# Patient Record
Sex: Female | Born: 1975 | Race: White | Hispanic: No | Smoking: Current every day smoker
Health system: Southern US, Community
[De-identification: ages and names within clinical notes are randomized; demographics above are authoritative.]

## PROBLEM LIST (undated history)

## (undated) DIAGNOSIS — N12 Tubulo-interstitial nephritis, not specified as acute or chronic: Secondary | ICD-10-CM

## (undated) DIAGNOSIS — M797 Fibromyalgia: Secondary | ICD-10-CM

## (undated) DIAGNOSIS — F4310 Post-traumatic stress disorder, unspecified: Secondary | ICD-10-CM

## (undated) HISTORY — PX: CHOLECYSTECTOMY: SHX55

## (undated) HISTORY — PX: TUBAL LIGATION: SHX77

---

## 2004-10-02 ENCOUNTER — Emergency Department: Payer: Self-pay | Admitting: Emergency Medicine

## 2007-01-23 ENCOUNTER — Emergency Department: Payer: Self-pay | Admitting: Emergency Medicine

## 2008-09-30 ENCOUNTER — Emergency Department: Payer: Self-pay | Admitting: Emergency Medicine

## 2011-06-21 ENCOUNTER — Emergency Department: Payer: Self-pay | Admitting: *Deleted

## 2011-06-21 LAB — COMPREHENSIVE METABOLIC PANEL
Bilirubin,Total: 0.3 mg/dL (ref 0.2–1.0)
Chloride: 103 mmol/L (ref 98–107)
Co2: 30 mmol/L (ref 21–32)
Creatinine: 0.88 mg/dL (ref 0.60–1.30)
EGFR (African American): >60
EGFR (Non-African Amer.): >60
Glucose: 79 mg/dL (ref 65–99)
Osmolality: 280 (ref 275–301)
Sodium: 141 mmol/L (ref 136–145)
Total Protein: 7 g/dL (ref 6.4–8.2)

## 2011-06-21 LAB — ETHANOL: Ethanol %: <0.003 % (ref 0.000–0.080)

## 2011-06-21 LAB — CBC
HCT: 42 % (ref 35.0–47.0)
HGB: 14.1 g/dL (ref 12.0–16.0)
MCH: 30.9 pg (ref 26.0–34.0)
MCV: 92 fL (ref 80–100)
Platelet: 272 10*3/uL (ref 150–440)
RBC: 4.56 10*6/uL (ref 3.80–5.20)

## 2011-06-21 LAB — TSH: Thyroid Stimulating Horm: 0.825 u[IU]/mL

## 2012-11-13 ENCOUNTER — Emergency Department: Payer: Self-pay | Admitting: Emergency Medicine

## 2012-11-13 LAB — COMPREHENSIVE METABOLIC PANEL
Alkaline Phosphatase: 77 U/L (ref 50–136)
Anion Gap: 9 (ref 7–16)
Bilirubin,Total: 0.5 mg/dL (ref 0.2–1.0)
Calcium, Total: 9.3 mg/dL (ref 8.5–10.1)
Chloride: 106 mmol/L (ref 98–107)
Co2: 24 mmol/L (ref 21–32)
Glucose: 131 mg/dL — ABNORMAL HIGH (ref 65–99)
Osmolality: 278 (ref 275–301)
Potassium: 3.9 mmol/L (ref 3.5–5.1)
Sodium: 139 mmol/L (ref 136–145)
Total Protein: 7.9 g/dL (ref 6.4–8.2)

## 2012-11-13 LAB — CBC
HCT: 43.9 % (ref 35.0–47.0)
HGB: 15 g/dL (ref 12.0–16.0)
MCH: 29.9 pg (ref 26.0–34.0)
MCHC: 34.2 g/dL (ref 32.0–36.0)
Platelet: 293 10*3/uL (ref 150–440)
RDW: 12.5 % (ref 11.5–14.5)
WBC: 10.2 10*3/uL (ref 3.6–11.0)

## 2012-11-13 LAB — URINALYSIS, COMPLETE
Bacteria: NONE SEEN
Bilirubin,UR: NEGATIVE
Glucose,UR: NEGATIVE mg/dL (ref 0–75)
Ph: 6 (ref 4.5–8.0)
WBC UR: 1 /HPF (ref 0–5)

## 2012-11-13 LAB — DRUG SCREEN, URINE
Amphetamines, Ur Screen: NEGATIVE (ref ?–1000)
Barbiturates, Ur Screen: NEGATIVE (ref ?–200)
Benzodiazepine, Ur Scrn: NEGATIVE (ref ?–200)
Cannabinoid 50 Ng, Ur ~~LOC~~: POSITIVE (ref ?–50)
Cocaine Metabolite,Ur ~~LOC~~: NEGATIVE (ref ?–300)
MDMA (Ecstasy)Ur Screen: NEGATIVE (ref ?–500)
Phencyclidine (PCP) Ur S: NEGATIVE (ref ?–25)

## 2012-11-13 LAB — TROPONIN I: Troponin-I: <0.02 ng/mL

## 2012-11-13 LAB — CK TOTAL AND CKMB (NOT AT ARMC): CK, Total: 79 U/L (ref 21–215)

## 2013-06-05 ENCOUNTER — Encounter: Payer: Self-pay | Admitting: Family Medicine

## 2013-07-01 ENCOUNTER — Encounter: Payer: Self-pay | Admitting: Family Medicine

## 2013-10-10 ENCOUNTER — Ambulatory Visit: Payer: Self-pay | Admitting: Surgery

## 2013-10-10 LAB — CBC
HCT: 40.7 % (ref 35.0–47.0)
HGB: 13.5 g/dL (ref 12.0–16.0)
MCH: 29.7 pg (ref 26.0–34.0)
MCHC: 33.1 g/dL (ref 32.0–36.0)
MCV: 90 fL (ref 80–100)
PLATELETS: 302 10*3/uL (ref 150–440)
RBC: 4.55 10*6/uL (ref 3.80–5.20)
RDW: 13.1 % (ref 11.5–14.5)
WBC: 8.4 10*3/uL (ref 3.6–11.0)

## 2013-10-11 ENCOUNTER — Ambulatory Visit: Payer: Self-pay | Admitting: Surgery

## 2013-10-12 LAB — PATHOLOGY REPORT

## 2014-02-28 ENCOUNTER — Emergency Department: Payer: Self-pay | Admitting: Student

## 2014-08-16 ENCOUNTER — Emergency Department: Payer: Self-pay | Admitting: Emergency Medicine

## 2014-09-21 NOTE — Op Note (Signed)
PATIENT NAME:  [name], Madeline [name] DATE OF BIRTH:  Nov 29, 1975  DATE OF PROCEDURE:  10/11/2013  PREOPERATIVE DIAGNOSIS: Hemorrhoids.   POSTOPERATIVE DIAGNOSIS: Hemorrhoids.   PROCEDURE: Hemorrhoidectomy.   SURGEON: Adella HareJ. Wilton [name], MD   ANESTHESIA: General.   INDICATIONS: This 39 year old female came in with a chief complaint of anal pain. She dates this back to originally developing hemorrhoids in 2003. She has recently had a popping sensation in the anal area with continued anal pain. No relief with hemorrhoid suppositories. Hemorrhoids were demonstrated on physical exam and recommended surgery.   DESCRIPTION OF PROCEDURE: The patient was placed on the operating table in the supine position under general anesthesia. Legs were elevated into the lithotomy position using ankle straps. The anal area was prepared with Betadine solution and draped with sterile towels and sheets.   There was a large hemorrhoid which was posterior and in the midline which was approximately 1.5 cm in dimension. The anoderm was infiltrated with 0.5% Sensorcaine with epinephrine and also deeper tissues surrounding the sphincter were infiltrated as well. Next, the anal canal was dilated large enough to admit 3 fingers. The bivalve anal retractor was introduced, demonstrating an internal hemorrhoid which is also posterior and slightly to the right of the midline. There were no other significantly enlarged hemorrhoids. No polyps or tumors were seen. No fissure was seen. Next, a high ligation of the internal component was done with a 2-0 chromic suture ligature. The incision was made externally in a V-shaped incision and was carried around the hemorrhoid. Next, numerous small bleeding points were cauterized. The external hemorrhoid was dissected away from the surrounding subcutaneous tissues. Dissection was carried up over the internal anal sphincter, which was identified, and dissection carried up to the previously  placed suture ligature, where the hemorrhoid was further ligated with the same suture ligature and then was excised. It was submitted in formalin for routine pathology. The wound was inspected. Several small bleeding points were cauterized. Some additional Sensorcaine with epinephrine was infiltrated within the wound. Next, the wound was repaired with a running 2-0 chromic locked stitch and left a small opening externally for drainage. Hemostasis appeared to be intact. Next, the bivalve anal retractor was removed. The dressings were applied with paper tape. The patient tolerated surgery satisfactorily and was then prepared for transfer to the recovery room.   ____________________________ Shela CommonsJ. Renda RollsWilton [name], MD jws:lb D: 10/11/2013 08:27:53 ET T: 10/11/2013 08:47:08 ET JOB#: [id number]  cc: Adella HareJ. Wilton [name], MD, <Dictator> Adella HareWILTON J [name] MD ELECTRONICALLY SIGNED 10/15/2013 21:07
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
## 2014-11-11 ENCOUNTER — Emergency Department
Admission: EM | Admit: 2014-11-11 | Discharge: 2014-11-11 | Disposition: A | Discharge status: Home / Self Care | Payer: No Typology Code available for payment source | Attending: Emergency Medicine | Admitting: Emergency Medicine

## 2014-11-11 ENCOUNTER — Encounter: Payer: Self-pay | Admitting: Emergency Medicine

## 2014-11-11 ENCOUNTER — Emergency Department: Payer: No Typology Code available for payment source

## 2014-11-11 DIAGNOSIS — S161XXA Strain of muscle, fascia and tendon at neck level, initial encounter: Secondary | ICD-10-CM | POA: Diagnosis not present

## 2014-11-11 DIAGNOSIS — Y9389 Activity, other specified: Secondary | ICD-10-CM | POA: Insufficient documentation

## 2014-11-11 DIAGNOSIS — Y92410 Unspecified street and highway as the place of occurrence of the external cause: Secondary | ICD-10-CM | POA: Diagnosis not present

## 2014-11-11 DIAGNOSIS — Y998 Other external cause status: Secondary | ICD-10-CM | POA: Diagnosis not present

## 2014-11-11 DIAGNOSIS — S3992XA Unspecified injury of lower back, initial encounter: Secondary | ICD-10-CM | POA: Diagnosis present

## 2014-11-11 DIAGNOSIS — Z720 Tobacco use: Secondary | ICD-10-CM | POA: Insufficient documentation

## 2014-11-11 DIAGNOSIS — Z79899 Other long term (current) drug therapy: Secondary | ICD-10-CM | POA: Diagnosis not present

## 2014-11-11 DIAGNOSIS — Z7952 Long term (current) use of systemic steroids: Secondary | ICD-10-CM | POA: Insufficient documentation

## 2014-11-11 HISTORY — DX: Post-traumatic stress disorder, unspecified: F43.10

## 2014-11-11 HISTORY — DX: Tubulo-interstitial nephritis, not specified as acute or chronic: N12

## 2014-11-11 HISTORY — DX: Fibromyalgia: M79.7

## 2014-11-11 MED ORDER — KETOROLAC TROMETHAMINE 30 MG/ML IJ SOLN
60.0000 mg | Freq: Once | INTRAMUSCULAR | Status: AC
  Administered 2014-11-11: 60 mg via INTRAMUSCULAR

## 2014-11-11 MED ORDER — CYCLOBENZAPRINE HCL 10 MG PO TABS: 10.0000 mg | ORAL_TABLET | Freq: Three times a day (TID) | ORAL | Status: DC | PRN

## 2014-11-11 MED ORDER — KETOROLAC TROMETHAMINE 60 MG/2ML IM SOLN
INTRAMUSCULAR | Status: AC
  Filled 2014-11-11: qty 2

## 2014-11-11 MED ORDER — HYDROCODONE-ACETAMINOPHEN 5-325 MG PO TABS: 1.0000 | ORAL_TABLET | ORAL | Status: DC | PRN

## 2014-11-11 MED ORDER — PREDNISONE 10 MG PO TABS: ORAL_TABLET | ORAL | Status: DC
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
## 2014-11-11 NOTE — Discharge Instructions (Signed)
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
## 2015-02-15 ENCOUNTER — Emergency Department
Admission: EM | Admit: 2015-02-15 | Discharge: 2015-02-15 | Disposition: A | Discharge status: Home / Self Care | Payer: No Typology Code available for payment source

## 2015-02-15 ENCOUNTER — Encounter: Payer: Self-pay | Admitting: Emergency Medicine

## 2015-02-15 ENCOUNTER — Emergency Department
Admission: EM | Admit: 2015-02-15 | Discharge: 2015-02-15 | Disposition: A | Discharge status: Home / Self Care | Payer: Medicaid Other | Attending: Emergency Medicine | Admitting: Emergency Medicine

## 2015-02-15 ENCOUNTER — Emergency Department: Payer: Medicaid Other

## 2015-02-15 DIAGNOSIS — Y92410 Unspecified street and highway as the place of occurrence of the external cause: Secondary | ICD-10-CM | POA: Insufficient documentation

## 2015-02-15 DIAGNOSIS — S161XXA Strain of muscle, fascia and tendon at neck level, initial encounter: Secondary | ICD-10-CM | POA: Diagnosis not present

## 2015-02-15 DIAGNOSIS — Y998 Other external cause status: Secondary | ICD-10-CM | POA: Diagnosis not present

## 2015-02-15 DIAGNOSIS — Y9389 Activity, other specified: Secondary | ICD-10-CM | POA: Diagnosis not present

## 2015-02-15 DIAGNOSIS — S79911A Unspecified injury of right hip, initial encounter: Secondary | ICD-10-CM | POA: Insufficient documentation

## 2015-02-15 DIAGNOSIS — Z79899 Other long term (current) drug therapy: Secondary | ICD-10-CM | POA: Insufficient documentation

## 2015-02-15 DIAGNOSIS — Z720 Tobacco use: Secondary | ICD-10-CM | POA: Insufficient documentation

## 2015-02-15 DIAGNOSIS — S79912A Unspecified injury of left hip, initial encounter: Secondary | ICD-10-CM | POA: Insufficient documentation

## 2015-02-15 DIAGNOSIS — S199XXA Unspecified injury of neck, initial encounter: Secondary | ICD-10-CM | POA: Diagnosis present

## 2015-02-15 MED ORDER — OXYCODONE-ACETAMINOPHEN 7.5-325 MG PO TABS: 1.0000 | ORAL_TABLET | Freq: Four times a day (QID) | ORAL | Status: DC | PRN

## 2015-02-15 MED ORDER — ORPHENADRINE CITRATE 30 MG/ML IJ SOLN
60.0000 mg | Freq: Two times a day (BID) | INTRAMUSCULAR | Status: DC
  Administered 2015-02-15: 60 mg via INTRAMUSCULAR
  Filled 2015-02-15: qty 2

## 2015-02-15 MED ORDER — HYDROMORPHONE HCL 1 MG/ML IJ SOLN
1.0000 mg | Freq: Once | INTRAMUSCULAR | Status: AC
  Administered 2015-02-15: 1 mg via INTRAMUSCULAR
  Filled 2015-02-15: qty 1
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
## 2015-08-09 ENCOUNTER — Emergency Department
Admission: EM | Admit: 2015-08-09 | Discharge: 2015-08-09 | Disposition: A | Discharge status: Home / Self Care | Payer: Medicaid Other | Attending: Emergency Medicine | Admitting: Emergency Medicine

## 2015-08-09 ENCOUNTER — Encounter: Payer: Self-pay | Admitting: Emergency Medicine

## 2015-08-09 DIAGNOSIS — F17210 Nicotine dependence, cigarettes, uncomplicated: Secondary | ICD-10-CM | POA: Insufficient documentation

## 2015-08-09 DIAGNOSIS — N10 Acute tubulo-interstitial nephritis: Secondary | ICD-10-CM | POA: Diagnosis not present

## 2015-08-09 DIAGNOSIS — R509 Fever, unspecified: Secondary | ICD-10-CM | POA: Diagnosis present

## 2015-08-09 LAB — BASIC METABOLIC PANEL
Anion gap: 3 — ABNORMAL LOW (ref 5–15)
BUN: 11 mg/dL (ref 6–20)
CO2: 28 mmol/L (ref 22–32)
Calcium: 8.7 mg/dL — ABNORMAL LOW (ref 8.9–10.3)
Chloride: 106 mmol/L (ref 101–111)
Creatinine, Ser: 0.8 mg/dL (ref 0.44–1.00)
GFR calc Af Amer: >60 mL/min (ref >60)
GFR calc non Af Amer: >60 mL/min (ref >60)
Glucose, Bld: 102 mg/dL — ABNORMAL HIGH (ref 65–99)
POTASSIUM: 3.4 mmol/L — AB (ref 3.5–5.1)
SODIUM: 137 mmol/L (ref 135–145)

## 2015-08-09 LAB — URINALYSIS COMPLETE WITH MICROSCOPIC (ARMC ONLY)
Bilirubin Urine: NEGATIVE
Glucose, UA: NEGATIVE mg/dL
Hgb urine dipstick: NEGATIVE
Ketones, ur: NEGATIVE mg/dL
Nitrite: POSITIVE — AB
PROTEIN: NEGATIVE mg/dL
Specific Gravity, Urine: 1.015 (ref 1.005–1.030)
pH: 6 (ref 5.0–8.0)

## 2015-08-09 LAB — CBC
HCT: 37.9 % (ref 35.0–47.0)
HEMOGLOBIN: 12.8 g/dL (ref 12.0–16.0)
MCH: 30 pg (ref 26.0–34.0)
MCHC: 33.6 g/dL (ref 32.0–36.0)
MCV: 89.3 fL (ref 80.0–100.0)
PLATELETS: 282 10*3/uL (ref 150–440)
RBC: 4.25 MIL/uL (ref 3.80–5.20)
RDW: 12.9 % (ref 11.5–14.5)
WBC: 13.2 10*3/uL — AB (ref 3.6–11.0)

## 2015-08-09 MED ORDER — SULFAMETHOXAZOLE-TRIMETHOPRIM 800-160 MG PO TABS: 1.0000 | ORAL_TABLET | Freq: Two times a day (BID) | ORAL | Status: DC

## 2015-08-09 NOTE — Discharge Instructions (Signed)

## 2015-08-09 NOTE — ED Notes (Signed)
Worried about having a febrile seizure from her fibromyalgia. Hx of pyleonephritis

## 2015-08-09 NOTE — ED Provider Notes (Signed)
CSN: [id number]     Arrival date & time 08/09/15  1117 History   First MD Initiated Contact with Patient 08/09/15 1139     Chief Complaint  Patient presents with  . Fever     (Consider location/radiation/quality/duration/timing/severity/associated sxs/prior Treatment) HPI  40 year old female presents of her department for evaluation of fever and back pain. Patient states 2-3 days she's had intermittent fevers, up to 101.3. She is taken Tylenol with relief. She describes some body aches, lower back pain and congestion. No cough, abdominal pain, nausea, vomiting, diarrhea. She is tolerating by mouth well. Patient does have a history urinary tract infections, usually will be without symptoms.   Past Medical History  Diagnosis Date  . Fibromyalgia   . PTSD (post-traumatic stress disorder)   . Pyelonephritis    Past Surgical History  Procedure Laterality Date  . Cholecystectomy    . Tubal ligation     History reviewed. No pertinent family history. Social History  Substance Use Topics  . Smoking status: Current Every Day Smoker -- 0.50 packs/day    Types: Cigarettes  . Smokeless tobacco: None  . Alcohol Use: Yes     Comment: weekly   OB History    No data available     Review of Systems  Constitutional: Positive for fever. Negative for chills, activity change and fatigue.  HENT: Negative for congestion, sinus pressure and sore throat.   Eyes: Negative for visual disturbance.  Respiratory: Negative for cough, chest tightness and shortness of breath.   Cardiovascular: Negative for chest pain and leg swelling.  Gastrointestinal: Negative for nausea, vomiting, abdominal pain and diarrhea.  Genitourinary: Negative for dysuria.  Musculoskeletal: Positive for back pain. Negative for arthralgias and gait problem.  Skin: Negative for rash.  Neurological: Negative for weakness, numbness and headaches.  Hematological: Negative for adenopathy.  Psychiatric/Behavioral: Negative for  behavioral problems, confusion and agitation.      Allergies  Ciprofloxacin  Home Medications   Prior to Admission medications   Medication Sig Start Date End Date Taking? Authorizing Provider  cyclobenzaprine (FLEXERIL) 10 MG tablet Take 1 tablet (10 mg total) by mouth every 8 (eight) hours as needed for muscle spasms. 11/11/14   Charmayne Sheerharles M Beers, PA-C  gabapentin (NEURONTIN) 300 MG capsule Take 300 mg by mouth 3 (three) times daily.    Historical Provider, MD  HYDROcodone-acetaminophen (NORCO) 5-325 MG per tablet Take 1-2 tablets by mouth every 4 (four) hours as needed for moderate pain. 11/11/14   Evangeline Dakinharles M Beers, PA-C  oxyCODONE-acetaminophen (PERCOCET) 7.5-325 MG per tablet Take 1 tablet by mouth every 6 (six) hours as needed for severe pain. 02/15/15   Joni Reiningonald K [name], PA-C  predniSONE (DELTASONE) 10 MG tablet Take 5 pills daily for 5 days. 11/11/14   Charmayne Sheerharles M Beers, PA-C  sulfamethoxazole-trimethoprim (BACTRIM DS,SEPTRA DS) 800-160 MG tablet Take 1 tablet by mouth 2 (two) times daily. 14 days 08/09/15   Evon Slackhomas C Gaines, PA-C   BP 135/77 mmHg  Pulse 102  Temp(Src) 99 F (37.2 C) (Oral)  Resp 18  Ht 5' (1.524 m)  Wt 61.236 kg  BMI 26.37 kg/m2  SpO2 97%  LMP 08/04/2015 Physical Exam  Constitutional: She is oriented to person, place, and time. She appears well-developed and well-nourished. No distress.  HENT:  Head: Normocephalic and atraumatic.  Mouth/Throat: Oropharynx is clear and moist.  Eyes: EOM are normal. Pupils are equal, round, and reactive to light. Right eye exhibits no discharge. Left eye exhibits no discharge.  Neck:  Normal range of motion. Neck supple.  Cardiovascular: Normal rate, regular rhythm and intact distal pulses.   Pulmonary/Chest: Effort normal and breath sounds normal. No respiratory distress. She has no wheezes. She has no rales. She exhibits no tenderness.  Abdominal: Soft. She exhibits no distension and no mass. There is no tenderness. There is no  rebound and no guarding.  Musculoskeletal: Normal range of motion. She exhibits no edema.  Mild left and right CVA tenderness.  Lymphadenopathy:    She has no cervical adenopathy.  Neurological: She is alert and oriented to person, place, and time. She has normal reflexes.  Skin: Skin is warm and dry.  Psychiatric: She has a normal mood and affect. Her behavior is normal. Thought content normal.    ED Course  Procedures (including critical care time) Labs Review Labs Reviewed  CBC - Abnormal; Notable for the following:    WBC 13.2 (*)    All other components within normal limits  BASIC METABOLIC PANEL - Abnormal; Notable for the following:    Potassium 3.4 (*)    Glucose, Bld 102 (*)    Calcium 8.7 (*)    Anion gap 3 (*)    All other components within normal limits  URINALYSIS COMPLETEWITH MICROSCOPIC ([facility] ONLY) - Abnormal; Notable for the following:    Bacteria, UA MANY (*)    Squamous Epithelial / LPF 6-30 (*)    All other components within normal limits    Imaging Review No results found. I have personally reviewed and evaluated these images and lab results as part of my medical decision-making.   EKG Interpretation None      MDM   Final diagnoses:  Acute pyelonephritis    40 year old female with pyelonephritis. She's had intermittent fevers and lower back pain for 3 days. Urinalysis shows infection. CBC shows slight leukocytosis. Patient is started with Bactrim DS 1 tab by mouth twice a day 14 days. She will increase fluids. She'll follow-up with primary care physician. Return to the ED for any worsening symptoms urgent changes in her health.    Evon Slack, PA-C 08/09/15 1302  Jene Every, MD 08/09/15 [phone]

## 2015-08-27 ENCOUNTER — Ambulatory Visit: Payer: Medicaid Other | Admitting: Anesthesiology

## 2015-09-11 ENCOUNTER — Encounter: Payer: Self-pay | Admitting: Emergency Medicine

## 2015-09-11 ENCOUNTER — Emergency Department
Admission: EM | Admit: 2015-09-11 | Discharge: 2015-09-12 | Disposition: A | Discharge status: Home / Self Care | Payer: Medicaid Other | Attending: Emergency Medicine | Admitting: Emergency Medicine

## 2015-09-11 ENCOUNTER — Emergency Department: Payer: Medicaid Other

## 2015-09-11 DIAGNOSIS — Y9289 Other specified places as the place of occurrence of the external cause: Secondary | ICD-10-CM | POA: Insufficient documentation

## 2015-09-11 DIAGNOSIS — W01198A Fall on same level from slipping, tripping and stumbling with subsequent striking against other object, initial encounter: Secondary | ICD-10-CM | POA: Insufficient documentation

## 2015-09-11 DIAGNOSIS — S72001A Fracture of unspecified part of neck of right femur, initial encounter for closed fracture: Secondary | ICD-10-CM | POA: Diagnosis not present

## 2015-09-11 DIAGNOSIS — Y998 Other external cause status: Secondary | ICD-10-CM | POA: Diagnosis not present

## 2015-09-11 DIAGNOSIS — Y93E2 Activity, laundry: Secondary | ICD-10-CM | POA: Diagnosis not present

## 2015-09-11 DIAGNOSIS — F4310 Post-traumatic stress disorder, unspecified: Secondary | ICD-10-CM | POA: Insufficient documentation

## 2015-09-11 DIAGNOSIS — Z9049 Acquired absence of other specified parts of digestive tract: Secondary | ICD-10-CM | POA: Insufficient documentation

## 2015-09-11 DIAGNOSIS — M25551 Pain in right hip: Secondary | ICD-10-CM | POA: Diagnosis present

## 2015-09-11 DIAGNOSIS — F17210 Nicotine dependence, cigarettes, uncomplicated: Secondary | ICD-10-CM | POA: Diagnosis not present

## 2015-09-11 LAB — POCT PREGNANCY, URINE: Preg Test, Ur: NEGATIVE

## 2015-09-11 MED ORDER — MELOXICAM 15 MG PO TABS: 15.0000 mg | ORAL_TABLET | Freq: Every day | ORAL | Status: DC

## 2015-09-11 MED ORDER — TRAMADOL HCL 50 MG PO TABS: 50.0000 mg | ORAL_TABLET | Freq: Four times a day (QID) | ORAL | Status: DC | PRN

## 2015-09-11 MED ORDER — TRAMADOL HCL 50 MG PO TABS
50.0000 mg | ORAL_TABLET | Freq: Once | ORAL | Status: AC
  Administered 2015-09-11: 50 mg via ORAL

## 2015-09-11 MED ORDER — TRAMADOL HCL 50 MG PO TABS
ORAL_TABLET | ORAL | Status: AC
  Filled 2015-09-11: qty 1

## 2015-09-11 NOTE — ED Notes (Signed)
Pt c/o right sided hip pain that radiates down right leg. Pt reports she fell down the stairs, landing onto a plastic basket. Pt denies LOC.

## 2015-09-11 NOTE — Discharge Instructions (Signed)
Avulsion Fracture  of the Pelvis An avulsion fracture of the ischial tuberosity of the pelvis is an injury to the bony part of the pelvis where the muscles in the back of the thigh (hamstring muscles) attach to tendons. These muscles are important in straightening the hip and bending the knee. An avulsion fracture of the ischial tuberosity of the pelvis commonly occurs at a growth plate on the back of the pelvis before the growth plate has closed (fused). CAUSES This injury happens when a tendon pulls off a piece of bone during a powerful contraction of a hamstring muscle. It often happens during activities that involve quick starts, running, jumping, and changing position quickly. RISK FACTORS This injury is more likely to occur in:  People who are younger than 38 years old.  People who play sports that involve running, jumping, kicking or quick starts, such as basketball, soccer, gymnastics and track and field.  People who have poor strength and flexibility.  People who do not warm up properly before practice or play.  People who have had a previous injury to the hip, thigh, or pelvis.  People who are overweight. SYMPTOMS Symptoms of this injury include:  Tenderness over the area of injury in the buttocks.  Mild swelling, warmth, or redness over the injury.  Weakness with activity, especially when extending the hip or bending the knee.  Pain with standing or walking.  Pain with stretching the hamstring muscle on the injured side.  A popping sound that happens at the time of injury.  Bruising on the back of the thigh within 1-2 days of the injury. DIAGNOSIS This injury is usually diagnosed with a physical exam and X-rays. TREATMENT This injury may be treated by:  Resting the injured area in a position that decreases the stretch on the involved tendons.  Walking with crutches.  Taking medicines for pain.  Working with a physical therapist to regain strength and motion in  the injured area.  Surgery. This may be needed in severe cases in which the bone does not heal on its own. HOME CARE INSTRUCTIONS Managing Pain, Stiffness, and Swelling  If directed, apply ice to the injured area:  Put ice in a plastic bag.  Place a towel between your skin and the bag.  Leave the ice on for 20 minutes, 2-3 times per day. Driving  Do not drive or operate heavy machinery while taking prescription pain medicine. Activity  Return to your normal activities as told by your health care provider. Ask your health care provider what activities are safe for you.  Perform exercises daily as told by your health care provider or physical therapist. Safety  Do not use the injured limb to support your body weight until your health care provider says that you can. Use crutches as told by your health care provider. General Instructions  Do not use any tobacco products, including cigarettes, chewing tobacco, or e-cigarettes. Tobacco can delay bone healing. If you need help quitting, ask your health care provider.  Take over-the-counter and prescription medicines only as told by your health care provider.  Keep all follow-up visits as told by your health care provider. This is important. SEEK MEDICAL CARE IF:  Your symptoms do not improve.  You have tingling or numbness in the leg on the side of your injury.   This information is not intended to replace advice given to you by your health care provider. Make sure you discuss any questions you have with your health care  provider.   Document Released: 05/17/2005 Document Revised: 02/05/2015 Document Reviewed: 07/16/2014 Elsevier Interactive Patient Education [employer]2016 [employer]

## 2015-09-11 NOTE — ED Notes (Signed)
Patient states that she tripped over her dog yesterday coming down stairs. Patient reports right lower back and hip pain. Patient reports that she felt it "pop" today and the pain has been worse since then.

## 2015-09-11 NOTE — ED Notes (Signed)
Reviewed d/c instructions, follow-up care, prescriptions, and use of ice with pt. Pt verbalized understanding 

## 2015-09-11 NOTE — ED Provider Notes (Signed)
[facility] Emergency Department Provider Note  ____________________________________________  Time seen: Approximately 10:52 PM  I have reviewed the triage vital signs and the nursing notes.   HISTORY  Chief Complaint Fall and Hip Pain    HPI Madeline [name] is a 40 y.o. female who presents emergency department complaining of right hip pain. Patient states that she was doing laundry yesterday when she tripped over her dog coming down the stairs. Patient states that she landed heavily on the laundry basket and half on the stairs. She is complaining of pain to the lateral right hip that radiates into her knee. Patient is ambulatory on leg but states that pain has increased after significant amount of ambulation at work today. Patient denies any numbness or tingling in her distal extremity. She denies any back pain. She denies hitting her head or losing consciousness at any point. She denies any saddle anesthesia, bowel or bladder dysfunction, paresthesias.   Past Medical History  Diagnosis Date  . Fibromyalgia   . PTSD (post-traumatic stress disorder)   . Pyelonephritis     There are no active problems to display for this patient.   Past Surgical History  Procedure Laterality Date  . Cholecystectomy    . Tubal ligation      Current Outpatient Rx  Name  Route  Sig  Dispense  Refill  . cyclobenzaprine (FLEXERIL) 10 MG tablet   Oral   Take 1 tablet (10 mg total) by mouth every 8 (eight) hours as needed for muscle spasms.   30 tablet   1   . gabapentin (NEURONTIN) 300 MG capsule   Oral   Take 300 mg by mouth 3 (three) times daily.         Marland Kitchen HYDROcodone-acetaminophen (NORCO) 5-325 MG per tablet   Oral   Take 1-2 tablets by mouth every 4 (four) hours as needed for moderate pain.   15 tablet   0   . meloxicam (MOBIC) 15 MG tablet   Oral   Take 1 tablet (15 mg total) by mouth daily.   30 tablet   0   . oxyCODONE-acetaminophen (PERCOCET)  7.5-325 MG per tablet   Oral   Take 1 tablet by mouth every 6 (six) hours as needed for severe pain.   12 tablet   0   . predniSONE (DELTASONE) 10 MG tablet      Take 5 pills daily for 5 days.   25 tablet   0   . sulfamethoxazole-trimethoprim (BACTRIM DS,SEPTRA DS) 800-160 MG tablet   Oral   Take 1 tablet by mouth 2 (two) times daily. 14 days   28 tablet   0   . traMADol (ULTRAM) 50 MG tablet   Oral   Take 1 tablet (50 mg total) by mouth every 6 (six) hours as needed.   10 tablet   0     Allergies Ciprofloxacin  No family history on file.  Social History Social History  Substance Use Topics  . Smoking status: Current Every Day Smoker -- 0.50 packs/day    Types: Cigarettes  . Smokeless tobacco: None  . Alcohol Use: Yes     Comment: occasionally     Review of Systems  Constitutional: No fever/chills Cardiovascular: no chest pain. Respiratory: no cough. No SOB. Musculoskeletal: Positive for right hip pain. Skin: Negative for rash. Neurological: Negative for headaches, focal weakness or numbness. 10-point ROS otherwise negative.  ____________________________________________   PHYSICAL EXAM:  VITAL SIGNS: ED Triage Vitals  Enc Vitals Group     BP 09/11/15 2156 124/82 mmHg     Pulse Rate 09/11/15 2156 91     Resp 09/11/15 2156 20     Temp 09/11/15 2156 98.2 F (36.8 C)     Temp Source 09/11/15 2156 Oral     SpO2 09/11/15 2156 100 %     Weight 09/11/15 2156 140 lb (63.504 kg)     Height 09/11/15 2156 5' (1.524 m)     Head Cir --      Peak Flow --      Pain Score 09/11/15 2157 8     Pain Loc --      Pain Edu? --      Excl. in GC? --      Constitutional: Alert and oriented. Well appearing and in no acute distress. Eyes: Conjunctivae are normal. PERRL. EOMI. Head: Atraumatic. Cardiovascular: Normal rate, regular rhythm. Normal S1 and S2.  Good peripheral circulation. Respiratory: Normal respiratory effort without tachypnea or retractions. Lungs  CTAB. Musculoskeletal: No visible deformity to Inspection. Patient has full range of motion to the right hip. No ecchymosis or contusions are noted. Patient does have tenderness to palpation over the lateral aspect of the hip. No palpable abnormality. Exam of the knee is unremarkable. Dorsalis pedis pulse is intact to right lower extremity. Sensation intact lower extremity. Neurologic:  Normal speech and language. No gross focal neurologic deficits are appreciated.  Skin:  Skin is warm, dry and intact. No rash noted. Psychiatric: Mood and affect are normal. Speech and behavior are normal. Patient exhibits appropriate insight and judgement.   ____________________________________________   LABS (all labs ordered are listed, but only abnormal results are displayed)  Labs Reviewed  POCT PREGNANCY, URINE   ____________________________________________  EKG   ____________________________________________  RADIOLOGY Festus Barren Kyaire Gruenewald, personally viewed and evaluated these images (plain radiographs) as part of my medical decision making, as well as reviewing the written report by the radiologist.  Dg Hip Unilat  With Pelvis 2-3 Views Right  09/11/2015  CLINICAL DATA:  Pain following fall EXAM: DG HIP (WITH OR WITHOUT PELVIS) 2-3V RIGHT COMPARISON:  None. FINDINGS: Frontal pelvis as well as frontal and lateral right hip images were obtained. There is a small calcification just lateral to the superior acetabulum, likely a small avulsion injury in this area. No other evidence of fracture. No dislocation. The joint spaces appear intact. No erosive change. IMPRESSION: Suspect small avulsion arising from the lateral aspect of the superior acetabulum on the right. No other evidence of fracture. No dislocation. No appreciable arthropathic change. Electronically Signed   By: Bretta Bang III M.D.   On: 09/11/2015 22:40     ____________________________________________    PROCEDURES  Procedure(s) performed:       Medications - No data to display   ____________________________________________   INITIAL IMPRESSION / ASSESSMENT AND PLAN / ED COURSE  Pertinent labs & imaging results that were available during my care of the patient were reviewed by me and considered in my medical decision making (see chart for details).  Patient's diagnosis is consistent with Avulsion fracture to the superior acetabulum on the right hip. X-ray reveals the above fracture. Patient's exam is reassuring at this time.. Patient will be discharged home with prescriptions for pain medication and anti-inflammatories. Patient is to follow up with orthopedics if symptoms persist past this treatment course. Patient is given ED precautions to return to the ED for any worsening or new symptoms.  ____________________________________________  FINAL CLINICAL IMPRESSION(S) / ED DIAGNOSES  Final diagnoses:  Avulsion fracture of hip, right, closed, initial encounter (HCC)      NEW MEDICATIONS STARTED DURING THIS VISIT:  New Prescriptions   MELOXICAM (MOBIC) 15 MG TABLET    Take 1 tablet (15 mg total) by mouth daily.   TRAMADOL (ULTRAM) 50 MG TABLET    Take 1 tablet (50 mg total) by mouth every 6 (six) hours as needed.        This chart was dictated using voice recognition software/Dragon. Despite best efforts to proofread, errors can occur which can change the meaning. Any change was purely unintentional.    Racheal PatchesJonathan D Keandra Medero, PA-C 09/11/15 [phone]  Phineas SemenGraydon Goodman, MD 09/11/15 2342

## 2017-06-30 IMAGING — CR DG HIP (WITH OR WITHOUT PELVIS) 2-3V*R*
1 series · 3 of 3 positions shown · non-contrast
Comparison: None.

CLINICAL DATA: Pain following fall

EXAM:
DG HIP (WITH OR WITHOUT PELVIS) 2-3V RIGHT

[Series 1: t hip ap right · 0.14mm/px · 3 of 3 slices shown]
[im 1/3]
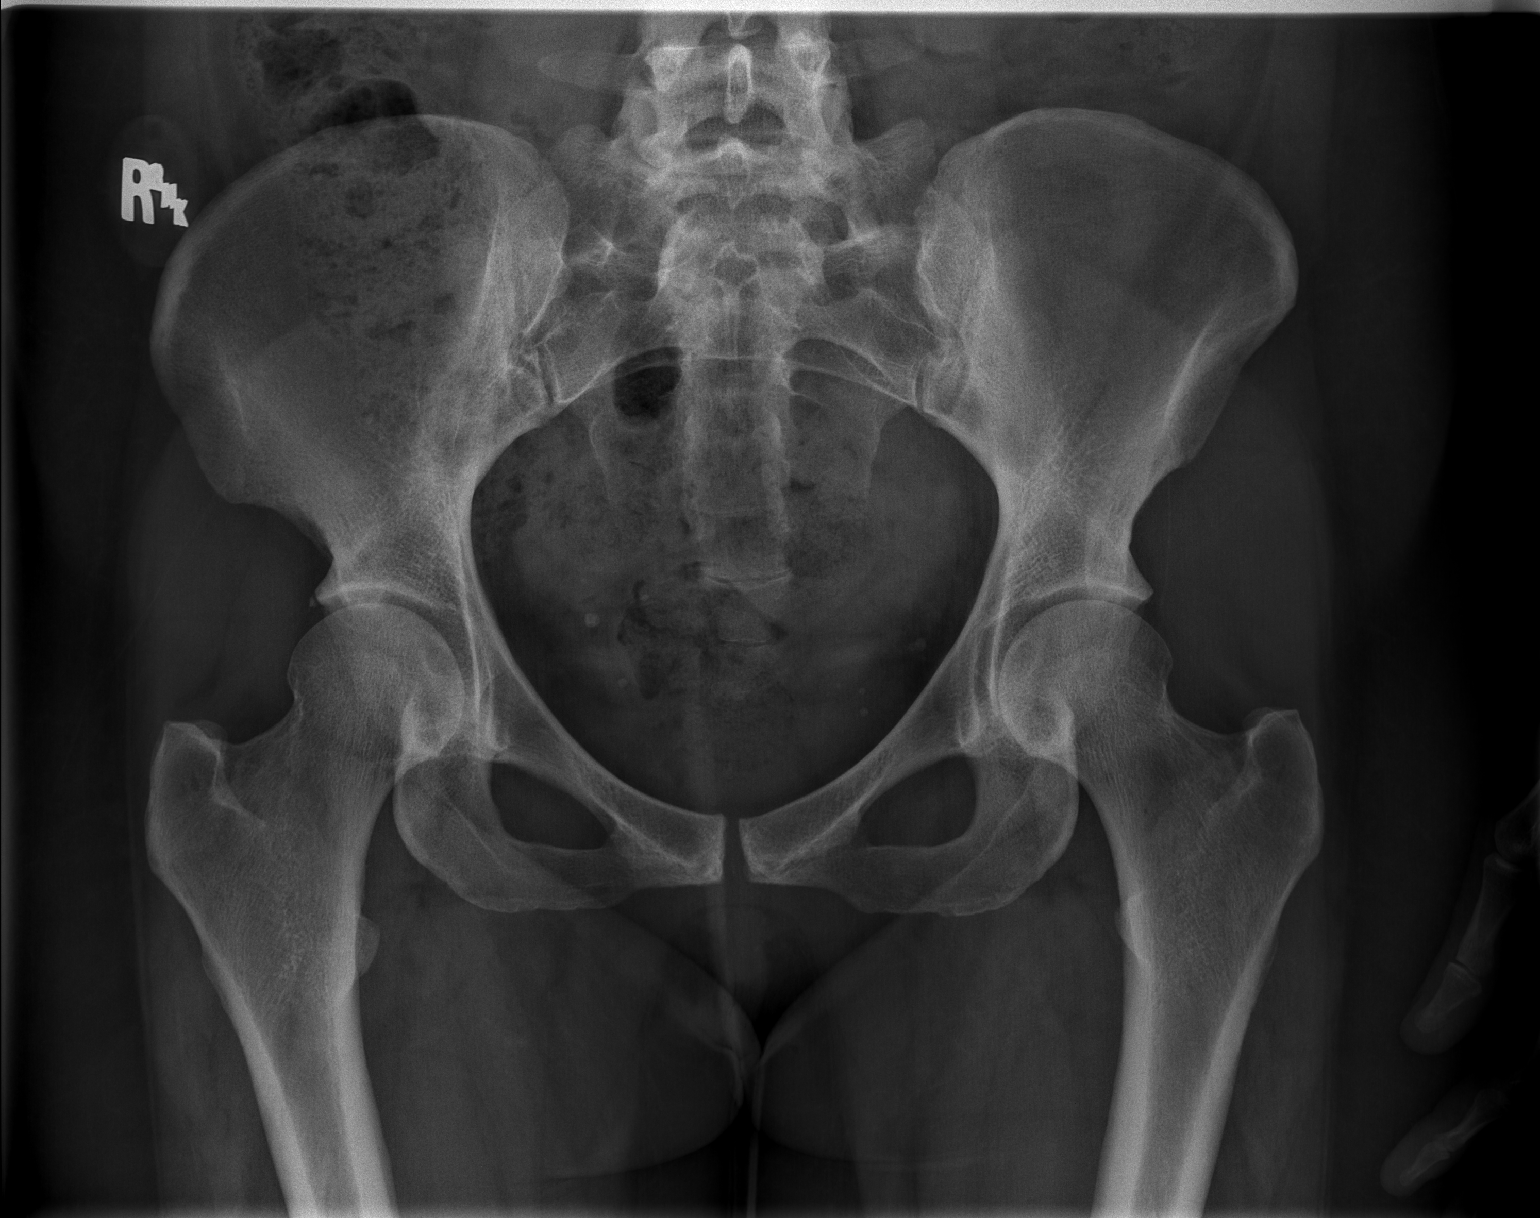
[im 2/3]
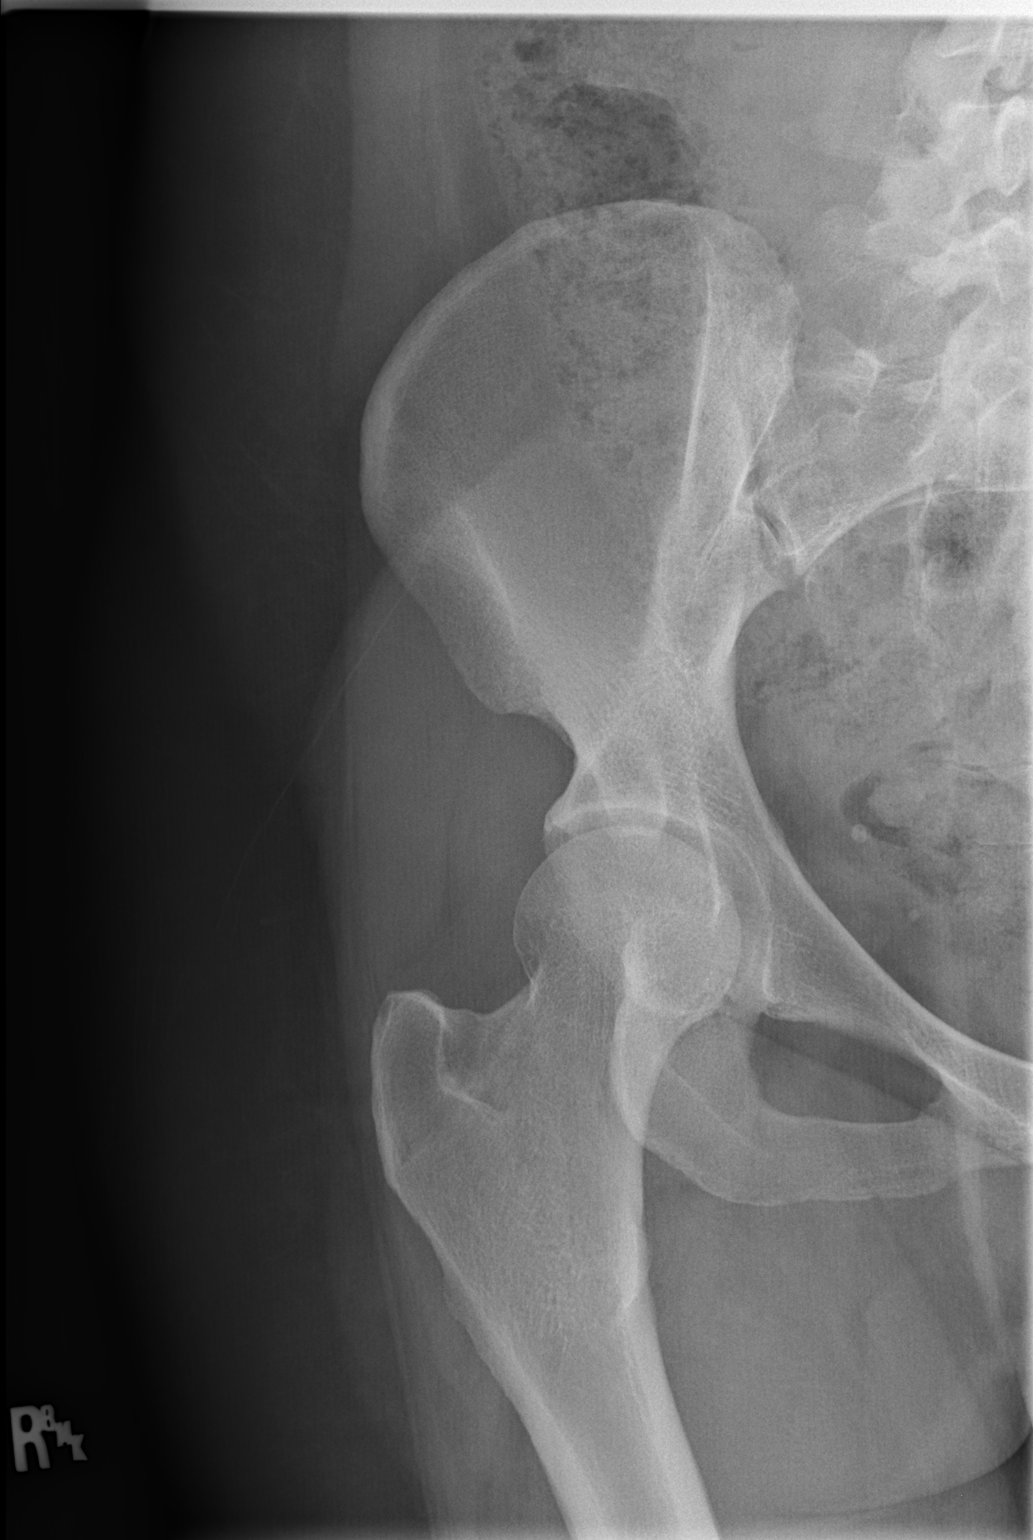
[im 3/3]
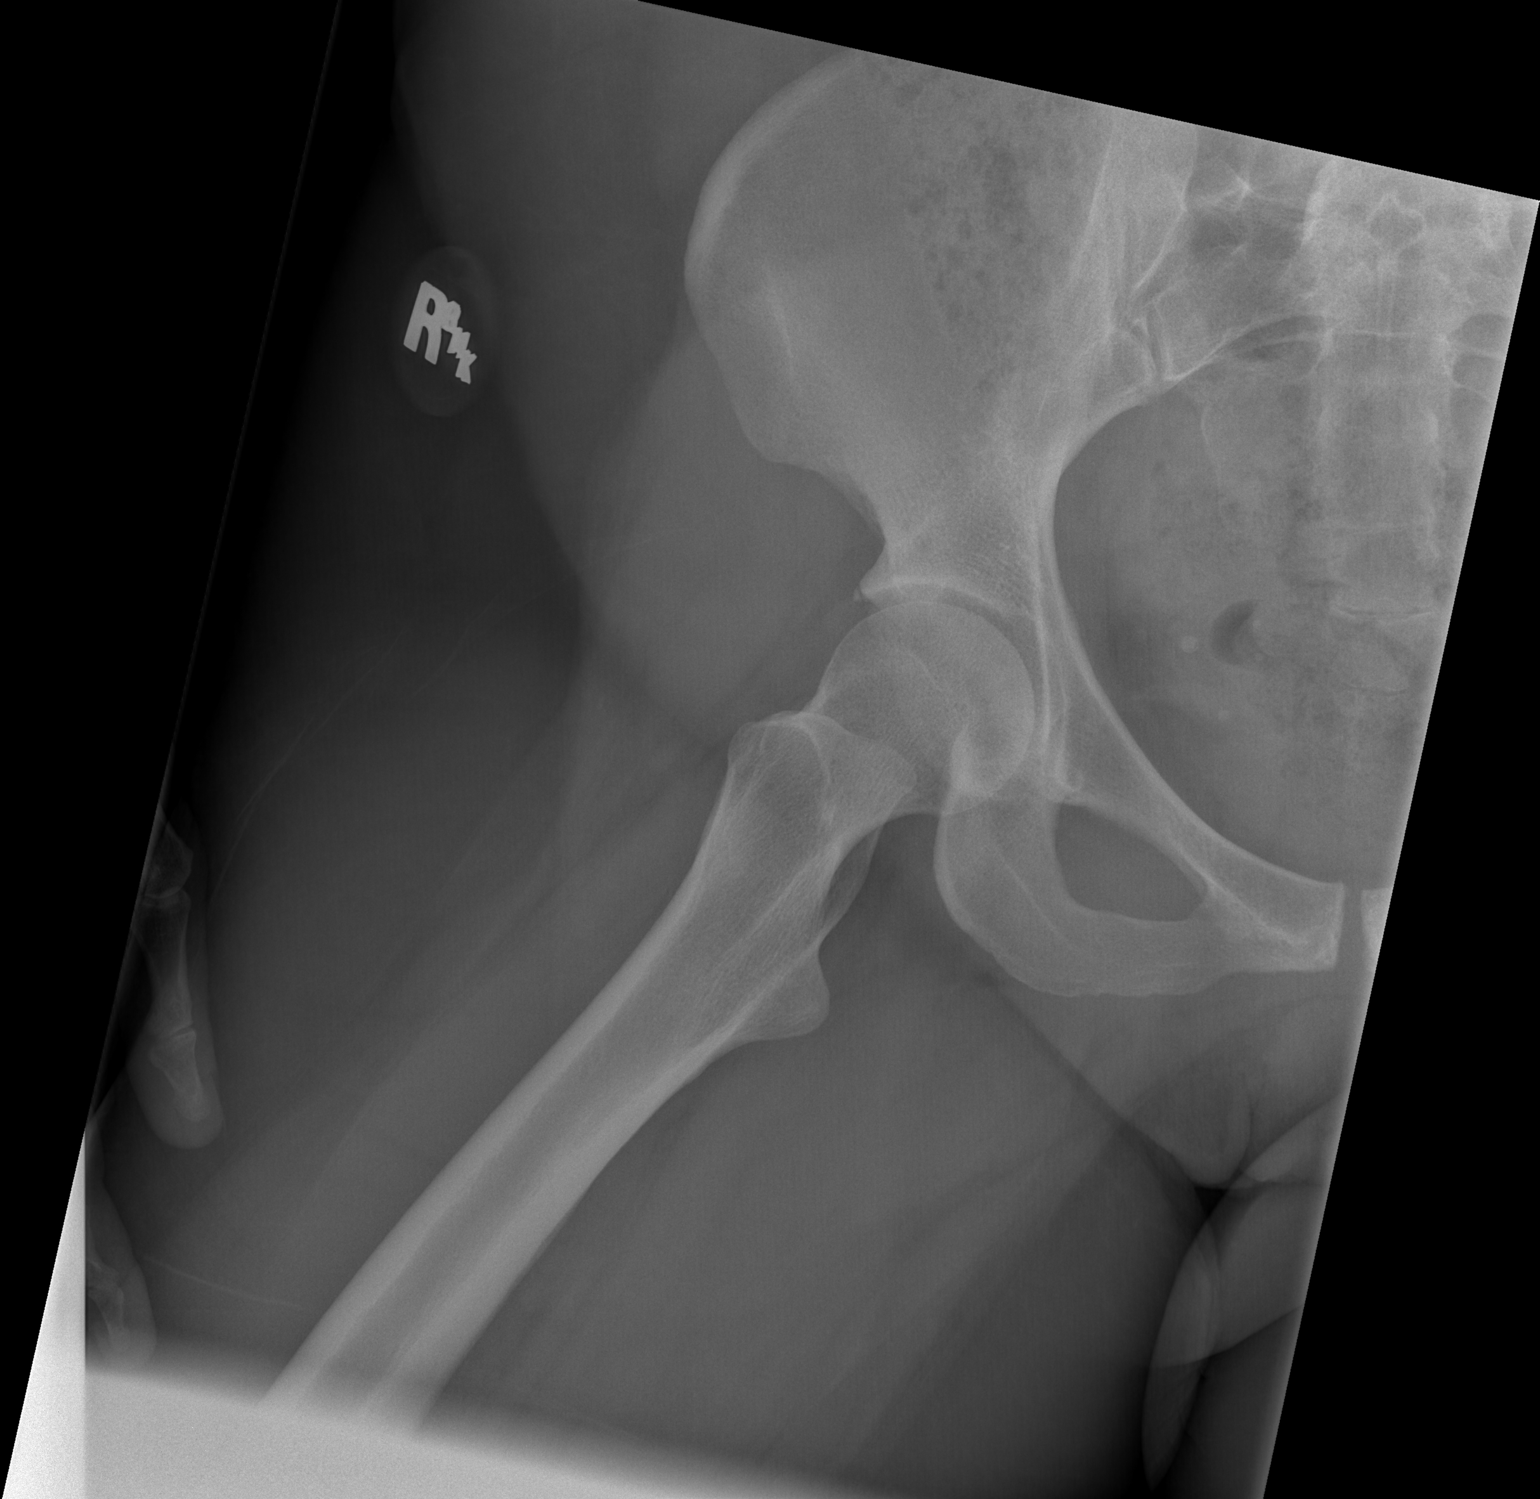

[3 of 3 positions shown; findings below may reference images not displayed]

FINDINGS: Frontal pelvis as well as frontal and lateral right hip images were
obtained. There is a small calcification just lateral to the
superior acetabulum, likely a small avulsion injury in this area. No
other evidence of fracture. No dislocation. The joint spaces appear
intact. No erosive change.
IMPRESSION: Suspect small avulsion arising from the lateral aspect of the
superior acetabulum on the right. No other evidence of fracture. No
dislocation. No appreciable arthropathic change.

## 2017-08-23 ENCOUNTER — Other Ambulatory Visit: Payer: Self-pay

## 2017-08-23 ENCOUNTER — Emergency Department
Admission: EM | Admit: 2017-08-23 | Discharge: 2017-08-23 | Disposition: A | Discharge status: Home / Self Care | Payer: Medicare Other | Attending: Emergency Medicine | Admitting: Emergency Medicine

## 2017-08-23 DIAGNOSIS — X102XXA Contact with fats and cooking oils, initial encounter: Secondary | ICD-10-CM | POA: Insufficient documentation

## 2017-08-23 DIAGNOSIS — Z23 Encounter for immunization: Secondary | ICD-10-CM | POA: Insufficient documentation

## 2017-08-23 DIAGNOSIS — Y929 Unspecified place or not applicable: Secondary | ICD-10-CM | POA: Insufficient documentation

## 2017-08-23 DIAGNOSIS — Y999 Unspecified external cause status: Secondary | ICD-10-CM | POA: Insufficient documentation

## 2017-08-23 DIAGNOSIS — Z79899 Other long term (current) drug therapy: Secondary | ICD-10-CM | POA: Insufficient documentation

## 2017-08-23 DIAGNOSIS — F17210 Nicotine dependence, cigarettes, uncomplicated: Secondary | ICD-10-CM | POA: Insufficient documentation

## 2017-08-23 DIAGNOSIS — S59912A Unspecified injury of left forearm, initial encounter: Secondary | ICD-10-CM | POA: Diagnosis present

## 2017-08-23 DIAGNOSIS — T22112A Burn of first degree of left forearm, initial encounter: Secondary | ICD-10-CM | POA: Insufficient documentation

## 2017-08-23 DIAGNOSIS — X19XXXA Contact with other heat and hot substances, initial encounter: Secondary | ICD-10-CM | POA: Diagnosis not present

## 2017-08-23 DIAGNOSIS — Y939 Activity, unspecified: Secondary | ICD-10-CM | POA: Diagnosis not present

## 2017-08-23 MED ORDER — IBUPROFEN 800 MG PO TABS
800.0000 mg | ORAL_TABLET | Freq: Once | ORAL | Status: AC
  Administered 2017-08-23: 800 mg via ORAL
  Filled 2017-08-23: qty 1

## 2017-08-23 MED ORDER — SILVER SULFADIAZINE 1 % EX CREA
TOPICAL_CREAM | CUTANEOUS | Status: AC
  Filled 2017-08-23: qty 85

## 2017-08-23 MED ORDER — SILVER SULFADIAZINE 1 % EX CREA: 1.0000 "application " | TOPICAL_CREAM | Freq: Every day | CUTANEOUS | 0 refills | Status: DC

## 2017-08-23 MED ORDER — SILVER SULFADIAZINE 1 % EX CREA
TOPICAL_CREAM | Freq: Once | CUTANEOUS | Status: AC
  Administered 2017-08-23: 23:00:00 via TOPICAL

## 2017-08-23 MED ORDER — TETANUS-DIPHTH-ACELL PERTUSSIS 5-2.5-18.5 LF-MCG/0.5 IM SUSP
0.5000 mL | Freq: Once | INTRAMUSCULAR | Status: AC
  Administered 2017-08-23: 0.5 mL via INTRAMUSCULAR
  Filled 2017-08-23: qty 0.5

## 2017-08-23 NOTE — Discharge Instructions (Addendum)
Change her bandage daily.  Apply Silvadene cream for the next 3-4 days.  After that he can apply Neosporin to the area.  If the area is worsening he should return to the emergency department right away.  It would be prudent for you to return to the emergency department or see your regular doctor in 2 days for a recheck.
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
## 2017-08-23 NOTE — ED Notes (Signed)
PT in NAD at time of departure, VSS, pt ambulatory to lobby. PT verbalizes d/c understanding and follow up at this time
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
## 2017-10-26 ENCOUNTER — Encounter: Payer: Self-pay | Admitting: Emergency Medicine

## 2017-10-26 ENCOUNTER — Emergency Department: Payer: Medicare Other

## 2017-10-26 ENCOUNTER — Emergency Department
Admission: EM | Admit: 2017-10-26 | Discharge: 2017-10-26 | Disposition: A | Discharge status: Home / Self Care | Payer: Medicare Other | Attending: Emergency Medicine | Admitting: Emergency Medicine

## 2017-10-26 DIAGNOSIS — M545 Low back pain: Secondary | ICD-10-CM | POA: Diagnosis present

## 2017-10-26 DIAGNOSIS — M461 Sacroiliitis, not elsewhere classified: Secondary | ICD-10-CM | POA: Diagnosis not present

## 2017-10-26 DIAGNOSIS — F17210 Nicotine dependence, cigarettes, uncomplicated: Secondary | ICD-10-CM | POA: Diagnosis not present

## 2017-10-26 DIAGNOSIS — M5136 Other intervertebral disc degeneration, lumbar region: Secondary | ICD-10-CM | POA: Insufficient documentation

## 2017-10-26 DIAGNOSIS — M47816 Spondylosis without myelopathy or radiculopathy, lumbar region: Secondary | ICD-10-CM

## 2017-10-26 DIAGNOSIS — M797 Fibromyalgia: Secondary | ICD-10-CM | POA: Insufficient documentation

## 2017-10-26 LAB — POCT PREGNANCY, URINE: PREG TEST UR: NEGATIVE

## 2017-10-26 MED ORDER — KETOROLAC TROMETHAMINE 30 MG/ML IJ SOLN
30.0000 mg | Freq: Once | INTRAMUSCULAR | Status: AC
  Administered 2017-10-26: 30 mg via INTRAMUSCULAR
  Filled 2017-10-26: qty 1

## 2017-10-26 MED ORDER — OXYCODONE-ACETAMINOPHEN 5-325 MG PO TABS: 1.0000 | ORAL_TABLET | ORAL | 0 refills | Status: AC | PRN

## 2017-10-26 MED ORDER — LIDOCAINE HCL (PF) 1 % IJ SOLN
5.0000 mL | Freq: Once | INTRAMUSCULAR | Status: AC
  Administered 2017-10-26: 5 mL via INTRADERMAL
  Filled 2017-10-26: qty 5

## 2017-10-26 MED ORDER — OXYCODONE-ACETAMINOPHEN 5-325 MG PO TABS
1.0000 | ORAL_TABLET | Freq: Once | ORAL | Status: AC
  Administered 2017-10-26: 1 via ORAL
  Filled 2017-10-26: qty 1

## 2017-10-26 MED ORDER — LIDOCAINE 5 % EX PTCH: 1.0000 | MEDICATED_PATCH | Freq: Two times a day (BID) | CUTANEOUS | 0 refills | Status: AC

## 2017-10-26 MED ORDER — CYCLOBENZAPRINE HCL 5 MG PO TABS: ORAL_TABLET | ORAL | 0 refills | Status: DC

## 2017-10-26 MED ORDER — ONDANSETRON 4 MG PO TBDP
4.0000 mg | ORAL_TABLET | Freq: Once | ORAL | Status: AC
  Administered 2017-10-26: 4 mg via ORAL
  Filled 2017-10-26: qty 1

## 2017-10-26 MED ORDER — KETOROLAC TROMETHAMINE 10 MG PO TABS: 10.0000 mg | ORAL_TABLET | Freq: Four times a day (QID) | ORAL | 0 refills | Status: DC | PRN
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
## 2017-10-26 NOTE — ED Triage Notes (Signed)
Pt comes into the ED via POV c/o leg pain.  Patient states "my leg popped".  Patient is ambulatory to triage at this time.  States she does not want to be evaluated with her family present in the room.

## 2017-10-26 NOTE — ED Notes (Signed)
See triage note  Presents with pain to tailbone  Pt is tearful in treatment room  Denies any injury or abscess area

## 2019-01-08 ENCOUNTER — Other Ambulatory Visit: Payer: Self-pay
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
## 2022-09-24 ENCOUNTER — Ambulatory Visit: Payer: Medicare Other | Admitting: Psychiatry

## 2023-04-12 DIAGNOSIS — R079 Chest pain, unspecified: Secondary | ICD-10-CM

## 2023-04-13 ENCOUNTER — Other Ambulatory Visit: Payer: Self-pay

## 2023-04-13 ENCOUNTER — Encounter
Admission: RE | Disposition: A | Discharge status: Home / Self Care | Payer: Self-pay | Source: Home / Self Care | Attending: Internal Medicine

## 2023-04-13 ENCOUNTER — Ambulatory Visit
Admission: RE | Admit: 2023-04-13 | Discharge: 2023-04-13 | Disposition: A | Discharge status: Home / Self Care | Payer: Medicare HMO | Attending: Internal Medicine | Admitting: Internal Medicine

## 2023-04-13 DIAGNOSIS — I2510 Atherosclerotic heart disease of native coronary artery without angina pectoris: Secondary | ICD-10-CM | POA: Diagnosis not present

## 2023-04-13 DIAGNOSIS — R079 Chest pain, unspecified: Secondary | ICD-10-CM | POA: Diagnosis present

## 2023-04-13 DIAGNOSIS — I2582 Chronic total occlusion of coronary artery: Secondary | ICD-10-CM | POA: Diagnosis not present

## 2023-04-13 HISTORY — PX: LEFT HEART CATH AND CORONARY ANGIOGRAPHY: CATH118249

## 2023-04-13 SURGERY — LEFT HEART CATH AND CORONARY ANGIOGRAPHY
Anesthesia: Moderate Sedation | Laterality: Left

## 2023-04-13 MED ORDER — MIDAZOLAM HCL 2 MG/2ML IJ SOLN
INTRAMUSCULAR | Status: DC | PRN
  Administered 2023-04-13: .5 mg via INTRAVENOUS

## 2023-04-13 MED ORDER — HEPARIN SODIUM (PORCINE) 1000 UNIT/ML IJ SOLN
INTRAMUSCULAR | Status: AC
  Filled 2023-04-13: qty 10

## 2023-04-13 MED ORDER — FENTANYL CITRATE (PF) 100 MCG/2ML IJ SOLN
INTRAMUSCULAR | Status: DC | PRN
  Administered 2023-04-13: 25 ug via INTRAVENOUS

## 2023-04-13 MED ORDER — ASPIRIN 81 MG PO CHEW: 81.0000 mg | CHEWABLE_TABLET | Freq: Every day | ORAL | Status: DC

## 2023-04-13 MED ORDER — SODIUM CHLORIDE 0.9% FLUSH: 3.0000 mL | INTRAVENOUS | Status: DC | PRN

## 2023-04-13 MED ORDER — IOHEXOL 300 MG/ML  SOLN
INTRAMUSCULAR | Status: DC | PRN
  Administered 2023-04-13: 112 mL

## 2023-04-13 MED ORDER — CLOPIDOGREL BISULFATE 75 MG PO TABS: 75.0000 mg | ORAL_TABLET | Freq: Every day | ORAL | Status: DC

## 2023-04-13 MED ORDER — ACETAMINOPHEN 325 MG PO TABS: 650.0000 mg | ORAL_TABLET | ORAL | Status: DC | PRN

## 2023-04-13 MED ORDER — ASPIRIN 81 MG PO CHEW
CHEWABLE_TABLET | ORAL | Status: AC
  Filled 2023-04-13: qty 1

## 2023-04-13 MED ORDER — HEPARIN (PORCINE) IN NACL 1000-0.9 UT/500ML-% IV SOLN
INTRAVENOUS | Status: AC
  Filled 2023-04-13: qty 1000

## 2023-04-13 MED ORDER — SODIUM CHLORIDE 0.9 % IV SOLN: INTRAVENOUS | Status: DC

## 2023-04-13 MED ORDER — SODIUM CHLORIDE 0.9 % IV SOLN: 250.0000 mL | INTRAVENOUS | Status: DC | PRN

## 2023-04-13 MED ORDER — ISOSORBIDE MONONITRATE ER 30 MG PO TB24: 30.0000 mg | ORAL_TABLET | Freq: Every day | ORAL | 0 refills | Status: DC

## 2023-04-13 MED ORDER — SODIUM CHLORIDE 0.9% FLUSH: 3.0000 mL | Freq: Two times a day (BID) | INTRAVENOUS | Status: DC

## 2023-04-13 MED ORDER — ONDANSETRON HCL 4 MG/2ML IJ SOLN: 4.0000 mg | Freq: Four times a day (QID) | INTRAMUSCULAR | Status: DC | PRN

## 2023-04-13 MED ORDER — FENTANYL CITRATE (PF) 100 MCG/2ML IJ SOLN
INTRAMUSCULAR | Status: AC
  Filled 2023-04-13: qty 2

## 2023-04-13 MED ORDER — HEPARIN (PORCINE) IN NACL 2000-0.9 UNIT/L-% IV SOLN
INTRAVENOUS | Status: DC | PRN
  Administered 2023-04-13: 1000 mL

## 2023-04-13 MED ORDER — ASPIRIN 81 MG PO CHEW
81.0000 mg | CHEWABLE_TABLET | ORAL | Status: AC
  Administered 2023-04-13: 81 mg via ORAL

## 2023-04-13 MED ORDER — MIDAZOLAM HCL 2 MG/2ML IJ SOLN
INTRAMUSCULAR | Status: AC
  Filled 2023-04-13: qty 2

## 2023-04-13 MED ORDER — LABETALOL HCL 5 MG/ML IV SOLN: 10.0000 mg | INTRAVENOUS | Status: DC | PRN

## 2023-04-13 MED ORDER — CLOPIDOGREL BISULFATE 75 MG PO TABS: 75.0000 mg | ORAL_TABLET | Freq: Every day | ORAL | 0 refills | Status: DC

## 2023-04-13 MED ORDER — VERAPAMIL HCL 2.5 MG/ML IV SOLN
INTRAVENOUS | Status: AC
Start: 2023-04-13 — End: ?
  Filled 2023-04-13: qty 2

## 2023-04-13 MED ORDER — SODIUM CHLORIDE 0.9 % WEIGHT BASED INFUSION: 1.0000 mL/kg/h | INTRAVENOUS | Status: DC

## 2023-04-13 MED ORDER — HEPARIN SODIUM (PORCINE) 1000 UNIT/ML IJ SOLN
INTRAMUSCULAR | Status: DC | PRN
  Administered 2023-04-13: 3500 [IU] via INTRAVENOUS

## 2023-04-13 MED ORDER — VERAPAMIL HCL 2.5 MG/ML IV SOLN
INTRAVENOUS | Status: DC | PRN
  Administered 2023-04-13: 2.5 mg via INTRAVENOUS

## 2023-04-13 SURGICAL SUPPLY — 10 items
CATH 5FR JL3.5 JR4 ANG PIG MP (CATHETERS) IMPLANT
DEVICE RAD TR BAND REGULAR (VASCULAR PRODUCTS) IMPLANT
DRAPE BRACHIAL (DRAPES) IMPLANT
GLIDESHEATH SLEND SS 6F .021 (SHEATH) IMPLANT
GUIDEWIRE INQWIRE 1.5J.035X260 (WIRE) IMPLANT
INQWIRE 1.5J .035X260CM (WIRE) ×1
PACK CARDIAC CATH (CUSTOM PROCEDURE TRAY) ×1 IMPLANT
PROTECTION STATION PRESSURIZED (MISCELLANEOUS) ×1
SET ATX-X65L (MISCELLANEOUS) IMPLANT
STATION PROTECTION PRESSURIZED (MISCELLANEOUS) IMPLANT

## 2023-04-14 LAB — CARDIAC CATHETERIZATION: Cath EF Quantitative: 35 %

## 2023-04-15 ENCOUNTER — Encounter: Payer: Self-pay | Admitting: Internal Medicine

## 2023-05-12 ENCOUNTER — Other Ambulatory Visit: Payer: Self-pay

## 2023-05-12 ENCOUNTER — Encounter: Payer: Self-pay | Admitting: Radiology

## 2023-05-12 ENCOUNTER — Emergency Department
Admission: EM | Admit: 2023-05-12 | Discharge: 2023-05-13 | Disposition: A | Discharge status: Other Acute Inpatient Hospital | Payer: Medicare HMO | Attending: Emergency Medicine | Admitting: Emergency Medicine

## 2023-05-12 DIAGNOSIS — Z79899 Other long term (current) drug therapy: Secondary | ICD-10-CM | POA: Insufficient documentation

## 2023-05-12 DIAGNOSIS — F32A Depression, unspecified: Secondary | ICD-10-CM | POA: Diagnosis not present

## 2023-05-12 DIAGNOSIS — F17290 Nicotine dependence, other tobacco product, uncomplicated: Secondary | ICD-10-CM | POA: Insufficient documentation

## 2023-05-12 DIAGNOSIS — Z955 Presence of coronary angioplasty implant and graft: Secondary | ICD-10-CM | POA: Diagnosis not present

## 2023-05-12 DIAGNOSIS — F23 Brief psychotic disorder: Secondary | ICD-10-CM | POA: Insufficient documentation

## 2023-05-12 DIAGNOSIS — F419 Anxiety disorder, unspecified: Secondary | ICD-10-CM | POA: Insufficient documentation

## 2023-05-12 DIAGNOSIS — F1290 Cannabis use, unspecified, uncomplicated: Secondary | ICD-10-CM | POA: Diagnosis not present

## 2023-05-12 DIAGNOSIS — F22 Delusional disorders: Secondary | ICD-10-CM | POA: Diagnosis not present

## 2023-05-12 DIAGNOSIS — F411 Generalized anxiety disorder: Secondary | ICD-10-CM | POA: Insufficient documentation

## 2023-05-12 DIAGNOSIS — F17210 Nicotine dependence, cigarettes, uncomplicated: Secondary | ICD-10-CM | POA: Diagnosis not present

## 2023-05-12 LAB — COMPREHENSIVE METABOLIC PANEL
ALT: 11 U/L (ref 0–44)
AST: 18 U/L (ref 15–41)
Albumin: 3.8 g/dL (ref 3.5–5.0)
Alkaline Phosphatase: 55 U/L (ref 38–126)
Anion gap: 7 (ref 5–15)
BUN: 8 mg/dL (ref 6–20)
CO2: 19 mmol/L — ABNORMAL LOW (ref 22–32)
Calcium: 8.7 mg/dL — ABNORMAL LOW (ref 8.9–10.3)
Chloride: 110 mmol/L (ref 98–111)
Creatinine, Ser: 0.79 mg/dL (ref 0.44–1.00)
GFR, Estimated: >60 mL/min (ref >60)
Glucose, Bld: 128 mg/dL — ABNORMAL HIGH (ref 70–99)
Potassium: 3.2 mmol/L — ABNORMAL LOW (ref 3.5–5.1)
Sodium: 136 mmol/L (ref 135–145)
Total Bilirubin: 0.6 mg/dL (ref <1.2)
Total Protein: 6.8 g/dL (ref 6.5–8.1)

## 2023-05-12 LAB — URINE DRUG SCREEN, QUALITATIVE (ARMC ONLY)
Amphetamines, Ur Screen: NOT DETECTED
Barbiturates, Ur Screen: NOT DETECTED
Benzodiazepine, Ur Scrn: NOT DETECTED
Cannabinoid 50 Ng, Ur ~~LOC~~: POSITIVE — AB
Cocaine Metabolite,Ur ~~LOC~~: NOT DETECTED
MDMA (Ecstasy)Ur Screen: NOT DETECTED
Methadone Scn, Ur: NOT DETECTED
Opiate, Ur Screen: NOT DETECTED
Phencyclidine (PCP) Ur S: NOT DETECTED
Tricyclic, Ur Screen: NOT DETECTED

## 2023-05-12 LAB — CBC
HCT: 41.2 % (ref 36.0–46.0)
Hemoglobin: 13.6 g/dL (ref 12.0–15.0)
MCH: 30.8 pg (ref 26.0–34.0)
MCHC: 33 g/dL (ref 30.0–36.0)
MCV: 93.2 fL (ref 80.0–100.0)
Platelets: 358 10*3/uL (ref 150–400)
RBC: 4.42 MIL/uL (ref 3.87–5.11)
RDW: 13 % (ref 11.5–15.5)
WBC: 11.2 10*3/uL — ABNORMAL HIGH (ref 4.0–10.5)
nRBC: 0 % (ref 0.0–0.2)

## 2023-05-12 LAB — ETHANOL: Alcohol, Ethyl (B): <10 mg/dL (ref <10)

## 2023-05-12 LAB — ACETAMINOPHEN LEVEL: Acetaminophen (Tylenol), Serum: <10 ug/mL — ABNORMAL LOW (ref 10–30)

## 2023-05-12 LAB — POC URINE PREG, ED: Preg Test, Ur: NEGATIVE

## 2023-05-12 LAB — SALICYLATE LEVEL: Salicylate Lvl: <7 mg/dL — ABNORMAL LOW (ref 7.0–30.0)

## 2023-05-12 MED ORDER — POTASSIUM CHLORIDE CRYS ER 20 MEQ PO TBCR
40.0000 meq | EXTENDED_RELEASE_TABLET | Freq: Once | ORAL | Status: AC
  Administered 2023-05-12: 40 meq via ORAL
  Filled 2023-05-12: qty 2

## 2023-05-12 MED ORDER — NICOTINE 14 MG/24HR TD PT24
14.0000 mg | MEDICATED_PATCH | Freq: Every day | TRANSDERMAL | Status: DC
Start: 2023-05-12 — End: 2023-05-13
  Administered 2023-05-12: 14 mg via TRANSDERMAL
  Filled 2023-05-12: qty 1

## 2023-05-12 NOTE — ED Notes (Signed)
Shoes Tee shirt Tank top bra Black and white stretch pants Underwear

## 2023-05-12 NOTE — ED Triage Notes (Addendum)
Necklace with [employer] and a snow man Hoop earrings 1 feather earring Star stud earring Eyebrow ring still in face due to inability to get out. Pt also has two other bags of belonging that PD has in hand.  Pt with two books that pages have been flipped though.
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
## 2023-05-12 NOTE — ED Notes (Signed)
Pt brought in by police from [facility].   Pt is IVC.  According to IVC paper work,  pt wants to harm others and believes people are out to get her.  Pt states she needed to remove herself from a situation.   Pt states everyone is doing drugs where she lives and they are making a dog lick her private parts.  Pt reports doing marijuana.  Pt denies SI.  Pt states HI.  Denies etoh use.

## 2023-05-12 NOTE — ED Notes (Signed)
Poct Pregnancy Negative 

## 2023-05-12 NOTE — ED Notes (Signed)
Pt states she is being made to do things with animals and that people are watching her even when her parents are gone.

## 2023-05-12 NOTE — ED Notes (Signed)
Pt stated she had not eaten since lunch time today. This tech got pt graham crackers and peanut butter and a soda for a snack. This tech also cleaned out 2 food trays from pt room.

## 2023-05-12 NOTE — ED Triage Notes (Signed)
Pt here voluntary with PD. Pt states she is just trying to get out of the situation she is in. Pt with very vulgar language in triage speaking of animals being between her legs licking her.

## 2023-05-13 ENCOUNTER — Inpatient Hospital Stay
Admission: AD | Admit: 2023-05-13 | Discharge: 2023-05-17 | DRG: 885 | Disposition: A | Discharge status: Home / Self Care | Payer: Medicare HMO | Source: Intra-hospital | Attending: Psychiatry | Admitting: Psychiatry

## 2023-05-13 ENCOUNTER — Encounter: Payer: Self-pay | Admitting: Psychiatric/Mental Health

## 2023-05-13 DIAGNOSIS — F17290 Nicotine dependence, other tobacco product, uncomplicated: Secondary | ICD-10-CM | POA: Diagnosis present

## 2023-05-13 DIAGNOSIS — F332 Major depressive disorder, recurrent severe without psychotic features: Principal | ICD-10-CM | POA: Diagnosis present

## 2023-05-13 DIAGNOSIS — F32A Depression, unspecified: Secondary | ICD-10-CM | POA: Insufficient documentation

## 2023-05-13 DIAGNOSIS — Z7982 Long term (current) use of aspirin: Secondary | ICD-10-CM

## 2023-05-13 DIAGNOSIS — G4700 Insomnia, unspecified: Secondary | ICD-10-CM | POA: Diagnosis present

## 2023-05-13 DIAGNOSIS — M797 Fibromyalgia: Secondary | ICD-10-CM | POA: Diagnosis present

## 2023-05-13 DIAGNOSIS — Z9049 Acquired absence of other specified parts of digestive tract: Secondary | ICD-10-CM | POA: Diagnosis not present

## 2023-05-13 DIAGNOSIS — Z885 Allergy status to narcotic agent status: Secondary | ICD-10-CM | POA: Diagnosis not present

## 2023-05-13 DIAGNOSIS — F1290 Cannabis use, unspecified, uncomplicated: Secondary | ICD-10-CM | POA: Diagnosis present

## 2023-05-13 DIAGNOSIS — Z881 Allergy status to other antibiotic agents status: Secondary | ICD-10-CM | POA: Diagnosis not present

## 2023-05-13 DIAGNOSIS — F12959 Cannabis use, unspecified with psychotic disorder, unspecified: Secondary | ICD-10-CM | POA: Diagnosis present

## 2023-05-13 DIAGNOSIS — F23 Brief psychotic disorder: Secondary | ICD-10-CM | POA: Insufficient documentation

## 2023-05-13 DIAGNOSIS — F17210 Nicotine dependence, cigarettes, uncomplicated: Secondary | ICD-10-CM | POA: Diagnosis present

## 2023-05-13 DIAGNOSIS — Z7902 Long term (current) use of antithrombotics/antiplatelets: Secondary | ICD-10-CM

## 2023-05-13 DIAGNOSIS — F411 Generalized anxiety disorder: Secondary | ICD-10-CM | POA: Insufficient documentation

## 2023-05-13 DIAGNOSIS — Z79899 Other long term (current) drug therapy: Secondary | ICD-10-CM | POA: Diagnosis not present

## 2023-05-13 DIAGNOSIS — I10 Essential (primary) hypertension: Secondary | ICD-10-CM | POA: Diagnosis present

## 2023-05-13 MED ORDER — TRAZODONE HCL 50 MG PO TABS
50.0000 mg | ORAL_TABLET | Freq: Every evening | ORAL | Status: DC | PRN
Start: 2023-05-13 — End: 2023-05-17
  Administered 2023-05-13: 50 mg via ORAL
  Filled 2023-05-13: qty 1

## 2023-05-13 MED ORDER — LORAZEPAM 2 MG/ML IJ SOLN: 2.0000 mg | Freq: Three times a day (TID) | INTRAMUSCULAR | Status: DC | PRN

## 2023-05-13 MED ORDER — ACETAMINOPHEN 325 MG PO TABS: 650.0000 mg | ORAL_TABLET | Freq: Four times a day (QID) | ORAL | Status: DC | PRN

## 2023-05-13 MED ORDER — TOPIRAMATE 25 MG PO TABS
50.0000 mg | ORAL_TABLET | Freq: Every day | ORAL | Status: DC
  Administered 2023-05-13 – 2023-05-17 (×5): 50 mg via ORAL
  Filled 2023-05-13 (×5): qty 2

## 2023-05-13 MED ORDER — ASPIRIN 81 MG PO TBEC
81.0000 mg | DELAYED_RELEASE_TABLET | Freq: Every day | ORAL | Status: DC
  Administered 2023-05-14 – 2023-05-17 (×4): 81 mg via ORAL
  Filled 2023-05-13 (×4): qty 1

## 2023-05-13 MED ORDER — METOPROLOL SUCCINATE ER 25 MG PO TB24
12.5000 mg | ORAL_TABLET | Freq: Every day | ORAL | Status: DC
  Administered 2023-05-13 – 2023-05-17 (×5): 12.5 mg via ORAL
  Filled 2023-05-13 (×5): qty 1

## 2023-05-13 MED ORDER — HALOPERIDOL LACTATE 5 MG/ML IJ SOLN: 5.0000 mg | Freq: Three times a day (TID) | INTRAMUSCULAR | Status: DC | PRN

## 2023-05-13 MED ORDER — CLOPIDOGREL BISULFATE 75 MG PO TABS
75.0000 mg | ORAL_TABLET | Freq: Every day | ORAL | Status: DC
  Administered 2023-05-14 – 2023-05-17 (×4): 75 mg via ORAL
  Filled 2023-05-13 (×4): qty 1

## 2023-05-13 MED ORDER — BUSPIRONE HCL 5 MG PO TABS
10.0000 mg | ORAL_TABLET | Freq: Two times a day (BID) | ORAL | Status: DC
  Administered 2023-05-13 – 2023-05-17 (×9): 10 mg via ORAL
  Filled 2023-05-13 (×9): qty 2

## 2023-05-13 MED ORDER — ISOSORBIDE MONONITRATE ER 30 MG PO TB24
30.0000 mg | ORAL_TABLET | Freq: Every day | ORAL | Status: DC
  Administered 2023-05-14 – 2023-05-17 (×4): 30 mg via ORAL
  Filled 2023-05-13 (×4): qty 1

## 2023-05-13 MED ORDER — HALOPERIDOL 5 MG PO TABS: 5.0000 mg | ORAL_TABLET | Freq: Three times a day (TID) | ORAL | Status: DC | PRN

## 2023-05-13 MED ORDER — HYDROXYZINE HCL 25 MG PO TABS
25.0000 mg | ORAL_TABLET | Freq: Three times a day (TID) | ORAL | Status: DC | PRN
  Administered 2023-05-13: 25 mg via ORAL
  Filled 2023-05-13: qty 1

## 2023-05-13 MED ORDER — VENLAFAXINE HCL ER 75 MG PO CP24
75.0000 mg | ORAL_CAPSULE | Freq: Every day | ORAL | Status: DC
  Administered 2023-05-14 – 2023-05-17 (×4): 75 mg via ORAL
  Filled 2023-05-13 (×4): qty 1

## 2023-05-13 MED ORDER — DIPHENHYDRAMINE HCL 50 MG/ML IJ SOLN: 50.0000 mg | Freq: Three times a day (TID) | INTRAMUSCULAR | Status: DC | PRN

## 2023-05-13 MED ORDER — ALUM & MAG HYDROXIDE-SIMETH 200-200-20 MG/5ML PO SUSP
30.0000 mL | ORAL | Status: DC | PRN
Start: 2023-05-13 — End: 2023-05-17
  Filled 2023-05-13: qty 30

## 2023-05-13 MED ORDER — IBUPROFEN 600 MG PO TABS
ORAL_TABLET | ORAL | Status: AC
  Filled 2023-05-13: qty 1

## 2023-05-13 MED ORDER — ROSUVASTATIN CALCIUM 20 MG PO TABS
40.0000 mg | ORAL_TABLET | Freq: Every day | ORAL | Status: DC
  Administered 2023-05-14 – 2023-05-16 (×4): 40 mg via ORAL
  Filled 2023-05-13 (×6): qty 2

## 2023-05-13 MED ORDER — HALOPERIDOL LACTATE 5 MG/ML IJ SOLN: 10.0000 mg | Freq: Three times a day (TID) | INTRAMUSCULAR | Status: DC | PRN

## 2023-05-13 MED ORDER — DIPHENHYDRAMINE HCL 25 MG PO CAPS: 50.0000 mg | ORAL_CAPSULE | Freq: Three times a day (TID) | ORAL | Status: DC | PRN

## 2023-05-13 MED ORDER — MELATONIN 5 MG PO TABS
2.5000 mg | ORAL_TABLET | Freq: Every day | ORAL | Status: DC
  Administered 2023-05-13 – 2023-05-16 (×4): 2.5 mg via ORAL
  Filled 2023-05-13 (×4): qty 1

## 2023-05-13 MED ORDER — NICOTINE 14 MG/24HR TD PT24
14.0000 mg | MEDICATED_PATCH | Freq: Every day | TRANSDERMAL | Status: DC
  Administered 2023-05-13 – 2023-05-17 (×5): 14 mg via TRANSDERMAL
  Filled 2023-05-13 (×5): qty 1

## 2023-05-13 MED ORDER — IBUPROFEN 600 MG PO TABS
600.0000 mg | ORAL_TABLET | Freq: Three times a day (TID) | ORAL | Status: DC | PRN
  Administered 2023-05-13 – 2023-05-17 (×5): 600 mg via ORAL
  Filled 2023-05-13 (×5): qty 1

## 2023-05-13 MED ORDER — MAGNESIUM HYDROXIDE 400 MG/5ML PO SUSP: 30.0000 mL | Freq: Every day | ORAL | Status: DC | PRN

## 2023-05-13 NOTE — BH IP Treatment Plan (Signed)
Interdisciplinary Treatment and Diagnostic Plan Update  05/13/2023 Time of Session: 9:16AM Madeline [name] MRN: [id number]  Principal Diagnosis: Acute psychosis [facility])  Secondary Diagnoses: Principal Problem:   Acute psychosis (HCC)   Current Medications:  Current Facility-Administered Medications  Medication Dose Route Frequency Provider Last Rate Last Admin   acetaminophen (TYLENOL) tablet 650 mg  650 mg Oral Q6H PRN Jearld Lesch, NP       alum & mag hydroxide-simeth (MAALOX/MYLANTA) 200-200-20 MG/5ML suspension 30 mL  30 mL Oral Q4H PRN Dixon, Rashaun M, NP       haloperidol (HALDOL) tablet 5 mg  5 mg Oral TID PRN Jearld Lesch, NP       And   diphenhydrAMINE (BENADRYL) capsule 50 mg  50 mg Oral TID PRN Jearld Lesch, NP       haloperidol lactate (HALDOL) injection 5 mg  5 mg Intramuscular TID PRN Jearld Lesch, NP       And   diphenhydrAMINE (BENADRYL) injection 50 mg  50 mg Intramuscular TID PRN Jearld Lesch, NP       And   LORazepam (ATIVAN) injection 2 mg  2 mg Intramuscular TID PRN Jearld Lesch, NP       haloperidol lactate (HALDOL) injection 10 mg  10 mg Intramuscular TID PRN Jearld Lesch, NP       And   diphenhydrAMINE (BENADRYL) injection 50 mg  50 mg Intramuscular TID PRN Jearld Lesch, NP       And   LORazepam (ATIVAN) injection 2 mg  2 mg Intramuscular TID PRN Jearld Lesch, NP       hydrOXYzine (ATARAX) tablet 25 mg  25 mg Oral TID PRN Jearld Lesch, NP       magnesium hydroxide (MILK OF MAGNESIA) suspension 30 mL  30 mL Oral Daily PRN Dixon, Rashaun M, NP       nicotine (NICODERM CQ - dosed in mg/24 hours) patch 14 mg  14 mg Transdermal Daily Dixon, Rashaun M, NP   14 mg at 05/13/23 0934   traZODone (DESYREL) tablet 50 mg  50 mg Oral QHS PRN Jearld Lesch, NP       PTA Medications: Medications Prior to Admission  Medication Sig Dispense Refill Last Dose/Taking   aspirin EC 81 MG tablet Take 81 mg by mouth daily. Swallow whole.       busPIRone (BUSPAR) 10 MG tablet Take 10 mg by mouth 2 (two) times daily.      clopidogrel (PLAVIX) 75 MG tablet Take 1 tablet (75 mg total) by mouth daily. 30 tablet 0    isosorbide mononitrate (IMDUR) 30 MG 24 hr tablet Take 1 tablet (30 mg total) by mouth daily. 30 tablet 0    metoprolol succinate (TOPROL-XL) 25 MG 24 hr tablet Take 12.5 mg by mouth daily.      OVER THE COUNTER MEDICATION Take 2 tablets by mouth daily. Olly stress gummy (Blue)      OVER THE COUNTER MEDICATION Take 1 tablet by mouth daily at 12 noon. Olly Daily Energy (Green)      rosuvastatin (CRESTOR) 40 MG tablet Take 40 mg by mouth daily.      venlafaxine (EFFEXOR) 75 MG tablet Take 75 mg by mouth 2 (two) times daily with a meal.       Patient Stressors: Marital or family conflict   Substance abuse   Traumatic event    Patient Strengths: Motivation for treatment/growth   Treatment  Modalities: Medication Management, Group therapy, Case management,  1 to 1 session with clinician, Psychoeducation, Recreational therapy.   Physician Treatment Plan for Primary Diagnosis: Acute psychosis (HCC) Long Term Goal(s):     Short Term Goals:    Medication Management: Evaluate patient's response, side effects, and tolerance of medication regimen.  Therapeutic Interventions: 1 to 1 sessions, Unit Group sessions and Medication administration.  Evaluation of Outcomes: Not Met  Physician Treatment Plan for Secondary Diagnosis: Principal Problem:   Acute psychosis (HCC)  Long Term Goal(s):     Short Term Goals:       Medication Management: Evaluate patient's response, side effects, and tolerance of medication regimen.  Therapeutic Interventions: 1 to 1 sessions, Unit Group sessions and Medication administration.  Evaluation of Outcomes: Not Met   RN Treatment Plan for Primary Diagnosis: Acute psychosis (HCC) Long Term Goal(s): Knowledge of disease and therapeutic regimen to maintain health will improve  Short Term  Goals: Ability to verbalize frustration and anger appropriately will improve, Ability to demonstrate self-control, Ability to participate in decision making will improve, Ability to verbalize feelings will improve, Ability to disclose and discuss suicidal ideas, and Ability to identify and develop effective coping behaviors will improve  Medication Management: RN will administer medications as ordered by provider, will assess and evaluate patient's response and provide education to patient for prescribed medication. RN will report any adverse and/or side effects to prescribing provider.  Therapeutic Interventions: 1 on 1 counseling sessions, Psychoeducation, Medication administration, Evaluate responses to treatment, Monitor vital signs and CBGs as ordered, Perform/monitor CIWA, COWS, AIMS and Fall Risk screenings as ordered, Perform wound care treatments as ordered.  Evaluation of Outcomes: Not Met   LCSW Treatment Plan for Primary Diagnosis: Acute psychosis (HCC) Long Term Goal(s): Safe transition to appropriate next level of care at discharge, Engage patient in therapeutic group addressing interpersonal concerns.  Short Term Goals: Engage patient in aftercare planning with referrals and resources, Increase social support, Increase ability to appropriately verbalize feelings, Increase emotional regulation, Facilitate acceptance of mental health diagnosis and concerns, Facilitate patient progression through stages of change regarding substance use diagnoses and concerns, and Increase skills for wellness and recovery  Therapeutic Interventions: Assess for all discharge needs, 1 to 1 time with Social worker, Explore available resources and support systems, Assess for adequacy in community support network, Educate family and significant other(s) on suicide prevention, Complete Psychosocial Assessment, Interpersonal group therapy.  Evaluation of Outcomes: Not Met   Progress in Treatment: Attending  groups: No. Participating in groups: No. Taking medication as prescribed: Yes. Toleration medication: Yes. Family/Significant other contact made: No, will contact:  CSW to contact once permission is granted.  Patient understands diagnosis: Yes. Discussing patient identified problems/goals with staff: Yes. Medical problems stabilized or resolved: Yes. and No. Denies suicidal/homicidal ideation: Yes. Issues/concerns per patient self-inventory: No. Other: None  New problem(s) identified: Yes, Describe:  Patient expressed complicated relationships with her step-mother. According to patient, step mother views her as a Proofreader." Patient appears to be somewhat manic as expressed to fast paced speech, repetitive through patterns and hyperfixation on targeted emotions.  New Short Term/Long Term Goal(s):detox, elimination of symptoms of psychosis, medication management for mood stabilization; elimination of SI thoughts; development of comprehensive mental wellness/sobriety plan.    Patient Goals:  "Get away from my stepmother."  Discharge Plan or Barriers: CSW to assist patient in the development of appropriate discharge plan.   Reason for Continuation of Hospitalization: Anxiety Delusions  Depression Mania Suicidal ideation  Estimated Length of Stay: 1-7 days.   Last 3 [place] Suicide Severity Risk Score: Flowsheet Row Admission (Current) from 05/13/2023 in [facility] INPATIENT BE[facility]AL MEDICINE ED from 05/12/2023 in [facility] Emergency Department at [facility]  C-SSRS RISK CATEGORY No Risk No Risk       Last PHQ 2/9 Scores:     No data to display          Scribe for Treatment Team: Lowry Ram, LCSW 05/13/2023 10:14 AM

## 2023-05-13 NOTE — Progress Notes (Signed)
   05/13/23 1133  Psych Admission Type (Psych Patients Only)  Admission Status Involuntary  Psychosocial Assessment  Patient Complaints Anxiety  Eye Contact Brief  Facial Expression Anxious;Sad  Affect Depressed  Speech Logical/coherent  Interaction Minimal  Motor Activity Slow  Appearance/Hygiene Unremarkable  Behavior Characteristics Cooperative  Mood Anxious  Thought Process  Coherency WDL  Content WDL  Delusions None reported or observed  Perception WDL  Hallucination None reported or observed  Judgment WDL  Confusion WDL  Danger to Self  Current suicidal ideation? Denies  Description of Agreement verbal  Danger to Others  Danger to Others None reported or observed

## 2023-05-13 NOTE — [facility] Counselor (Signed)
CSW met with pt briefly with NP. Pt shared that her stepmother and neighbors all have inappropriate relationships with their service animals. She stated that she does not want to return there upon discharge. Pt then began to complain about having the worst headache ever. Seeing pt distress, CSW informed pt that he would let her take her medicine and rest, in hopes that she feels better. Pt agreed. No other concerns expressed. Contact ended without incident.   CSW will meet with pt at a later date.   Madeline [name]. Algis Greenhouse, MSW, LCSW, LCAS 05/13/2023 3:27 PM
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
## 2023-05-13 NOTE — Group Note (Signed)
Date:  05/13/2023 Time:  5:44 PM  Group Topic/Focus:  Wellness Toolbox:   The focus of this group is to discuss various aspects of wellness, balancing those aspects and exploring ways to increase the ability to experience wellness.  Patients will create a wellness toolbox for use upon discharge.    Participation Level:  Did Not Attend   Lynelle Sm[employer] [facility] 05/13/2023, 5:44 PM
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
## 2023-05-13 NOTE — Group Note (Signed)
Date:  05/13/2023 Time:  8:56 PM  Group Topic/Focus:  Wrap-Up Group:   The focus of this group is to help patients review their daily goal of treatment and discuss progress on daily workbooks.    Participation Level:  Did Not Attend   Katina Dung 05/13/2023, 8:56 PM

## 2023-05-13 NOTE — Plan of Care (Signed)
  Problem: Education: Goal: Knowledge of [place] [facility] Education information/materials will improve Outcome: Progressing Goal: Emotional status will improve Outcome: Progressing   Problem: Activity: Goal: Interest or engagement in activities will improve Outcome: Progressing Goal: Sleeping patterns will improve Outcome: Progressing   Problem: Coping: Goal: Ability to verbalize frustrations and anger appropriately will improve Outcome: Progressing

## 2023-05-13 NOTE — Group Note (Signed)
Date:  05/13/2023 Time:  11:04 AM  Group Topic/Focus:  Goals Group:   The focus of this group is to help patients establish daily goals to achieve during treatment and discuss how the patient can incorporate goal setting into their daily lives to aide in recovery. Wellness Toolbox:   The focus of this group is to discuss various aspects of wellness, balancing those aspects and exploring ways to increase the ability to experience wellness.  Patients will create a wellness toolbox for use upon discharge.    Participation Level:  Active  Participation Quality:  Appropriate  Affect:  Appropriate  Cognitive:  Appropriate  Insight: Appropriate  Engagement in Group:  Engaged  Modes of Intervention:  Discussion, Education, and Support  Additional Comments:    Wilford Corner 05/13/2023, 11:04 AM

## 2023-05-13 NOTE — Plan of Care (Signed)
New Admission  Problem: Education: Goal: Knowledge of General Education information will improve Description: Including pain rating scale, medication(s)/side effects and non-pharmacologic comfort measures Outcome: Not Progressing   Problem: Health Behavior/Discharge Planning: Goal: Ability to manage health-related needs will improve Outcome: Not Progressing   Problem: Clinical Measurements: Goal: Ability to maintain clinical measurements within normal limits will improve Outcome: Not Progressing Goal: Will remain free from infection Outcome: Not Progressing Goal: Diagnostic test results will improve Outcome: Not Progressing Goal: Respiratory complications will improve Outcome: Not Progressing Goal: Cardiovascular complication will be avoided Outcome: Not Progressing   Problem: Activity: Goal: Risk for activity intolerance will decrease Outcome: Not Progressing   Problem: Nutrition: Goal: Adequate nutrition will be maintained Outcome: Not Progressing   Problem: Coping: Goal: Level of anxiety will decrease Outcome: Not Progressing

## 2023-05-14 DIAGNOSIS — F23 Brief psychotic disorder: Secondary | ICD-10-CM | POA: Diagnosis not present

## 2023-05-14 LAB — LIPID PANEL
Cholesterol: 202 mg/dL — ABNORMAL HIGH (ref 0–200)
HDL: 53 mg/dL (ref >40)
LDL Cholesterol: 132 mg/dL — ABNORMAL HIGH (ref 0–99)
Total CHOL/HDL Ratio: 3.8 {ratio}
Triglycerides: 87 mg/dL (ref <150)
VLDL: 17 mg/dL (ref 0–40)

## 2023-05-14 NOTE — Group Note (Signed)
Date:  05/14/2023 Time:  1:10 PM  Group Topic/Focus:  Conflict Resolution:   The focus of this group is to discuss the conflict resolution process and how it may be used upon discharge. Emotional Education:   The focus of this group is to discuss what feelings/emotions are, and how they are experienced. Managing Feelings:   The focus of this group is to identify what feelings patients have difficulty handling and develop a plan to handle them in a healthier way upon discharge.    Participation Level:  Did Not Attend  Participation Quality:    Affect:    Cognitive:    Insight:   Engagement in Group:    Modes of Intervention:    Additional Comments:    Lynna Zamorano 05/14/2023, 1:10 PM

## 2023-05-14 NOTE — Progress Notes (Signed)
   05/13/23 2000  Psych Admission Type (Psych Patients Only)  Admission Status Involuntary  Psychosocial Assessment  Patient Complaints Anxiety  Eye Contact Brief  Facial Expression Anxious  Affect Depressed  Speech Logical/coherent  Interaction Minimal  Motor Activity Slow  Appearance/Hygiene Unremarkable  Behavior Characteristics Cooperative  Mood Anxious  Aggressive Behavior  Effect No apparent injury  Thought Process  Coherency WDL  Content WDL  Delusions None reported or observed  Perception WDL  Hallucination None reported or observed  Judgment WDL  Confusion WDL  Danger to Self  Current suicidal ideation? Denies  Danger to Others  Danger to Others None reported or observed

## 2023-05-14 NOTE — Plan of Care (Signed)
°  Problem: Education: °Goal: Knowledge of [place] [facility] Education information/materials will improve °Outcome: Not Progressing °Goal: Emotional status will improve °Outcome: Not Progressing °Goal: Mental status will improve °Outcome: Not Progressing °Goal: Verbalization of understanding the information provided will improve °Outcome: Not Progressing °  °Problem: Activity: °Goal: Interest or engagement in activities will improve °Outcome: Not Progressing °Goal: Sleeping patterns will improve °Outcome: Not Progressing °  °Problem: Coping: °Goal: Ability to verbalize frustrations and anger appropriately will improve °Outcome: Not Progressing °Goal: Ability to demonstrate self-control will improve °Outcome: Not Progressing °  °Problem: Health Behavior/Discharge Planning: °Goal: Identification of resources available to assist in meeting health care needs will improve °Outcome: Not Progressing °Goal: Compliance with treatment plan for underlying cause of condition will improve °Outcome: Not Progressing °  °Problem: Safety: °Goal: Periods of time without injury will increase °Outcome: Not Progressing °  °

## 2023-05-14 NOTE — Group Note (Signed)
Date:  05/14/2023 Time:  10:10 PM  Group Topic/Focus:  Wrap-Up Group:   The focus of this group is to help patients review their daily goal of treatment and discuss progress on daily workbooks.    Participation Level:  Did Not Attend   Katina Dung 05/14/2023, 10:10 PM

## 2023-05-14 NOTE — Progress Notes (Incomplete)
[facility] MD Progress Note  05/15/2023 6:22 PM Madeline [name]  MRN:  [id number] Subjective:  47 year old Caucasian female who has been discussing vulgar acts openly with others in the milieu. Staff have encouraged her to refrain from behaviors that may trigger or distress others. Despite redirection, the patient continues to engage in these conversations and refuses to attend group therapy sessions. She denies suicidal ideation (SI), homicidal ideation (HI), or auditory/visual hallucinations (AVH). The patient does not appear distressed but shows limited insight into the impact of her behavior on others. Principal Problem: Acute psychosis (HCC) Diagnosis: Principal Problem:   Acute psychosis (HCC) Active Problems:   Marijuana smoker, continuous  Total Time spent with patient: 1 hour  Past Psychiatric History: see below  Past Medical History:  Past Medical History:  Diagnosis Date   Fibromyalgia    PTSD (post-traumatic stress disorder)    Pyelonephritis     Past Surgical History:  Procedure Laterality Date   CHOLECYSTECTOMY     LEFT HEART CATH AND CORONARY ANGIOGRAPHY Left 04/13/2023   Procedure: LEFT HEART CATH AND CORONARY ANGIOGRAPHY;  Surgeon: Alwyn Pea, MD;  Location: [facility] INVASIVE CV LAB;  Service: Cardiovascular;  Laterality: Left;   TUBAL LIGATION     Family History: History reviewed. No pertinent family history. Family Psychiatric  History: none reported Social History:  Social History   Substance and Sexual Activity  Alcohol Use Yes   Comment: occasionally     Social History   Substance and Sexual Activity  Drug Use Yes   Types: Marijuana    Social History   Socioeconomic History   Marital status: Legally Separated    Spouse name: Not on file   Number of children: Not on file   Years of education: Not on file   Highest education level: Not on file  Occupational History   Not on file  Tobacco Use   Smoking status: Every Day    Current packs/day: 0.50     Types: Cigarettes   Smokeless tobacco: Never  Vaping Use   Vaping status: Every Day  Substance and Sexual Activity   Alcohol use: Yes    Comment: occasionally   Drug use: Yes    Types: Marijuana   Sexual activity: Not Currently  Other Topics Concern   Not on file  Social History Narrative   Not on file   Social Drivers of Health   Financial Resource Strain: Not on File (06/04/2021)   Received from [employer] [employer]    Financial Resource Strain: 0  Food Insecurity: Patient Declined (05/13/2023)   Hunger Vital Sign    Worried About Running Out of Food in the Last Year: Patient declined    Ran Out of Food in the Last Year: Patient declined  Transportation Needs: No Transportation Needs (05/13/2023)   PRAPARE - Administrator, Civil Service (Medical): No    Lack of Transportation (Non-Medical): No  Physical Activity: Not on File (06/04/2021)   Received from [place], [place]   Physical Activity    Physical Activity: 0  Stress: Not on File (06/04/2021)   Received from [facility], [place]   Stress    Stress: 0  Social Connections: Not on File (02/12/2023)   Received from [facility]   Social Connections    Connectedness: 0   Additional Social History:                         Sleep: Good  Appetite:  Good  Current Medications: Current Facility-Administered Medications  Medication Dose Route Frequency Provider Last Rate Last Admin   acetaminophen (TYLENOL) tablet 650 mg  650 mg Oral Q6H PRN Dixon, Rashaun M, NP       alum & mag hydroxide-simeth (MAALOX/MYLANTA) 200-200-20 MG/5ML suspension 30 mL  30 mL Oral Q4H PRN Jearld Lesch, NP       aspirin EC tablet 81 mg  81 mg Oral Daily Myriam Forehand, NP   81 mg at 05/15/23 0909   busPIRone (BUSPAR) tablet 10 mg  10 mg Oral BID Myriam Forehand, NP   10 mg at 05/15/23 1708   clopidogrel (PLAVIX) tablet 75 mg  75 mg Oral Daily Myriam Forehand, NP   75 mg at 05/15/23 1610   haloperidol (HALDOL) tablet 5 mg   5 mg Oral TID PRN Jearld Lesch, NP       And   diphenhydrAMINE (BENADRYL) capsule 50 mg  50 mg Oral TID PRN Jearld Lesch, NP       haloperidol lactate (HALDOL) injection 5 mg  5 mg Intramuscular TID PRN Jearld Lesch, NP       And   diphenhydrAMINE (BENADRYL) injection 50 mg  50 mg Intramuscular TID PRN Jearld Lesch, NP       And   LORazepam (ATIVAN) injection 2 mg  2 mg Intramuscular TID PRN Jearld Lesch, NP       haloperidol lactate (HALDOL) injection 10 mg  10 mg Intramuscular TID PRN Jearld Lesch, NP       And   diphenhydrAMINE (BENADRYL) injection 50 mg  50 mg Intramuscular TID PRN Jearld Lesch, NP       And   LORazepam (ATIVAN) injection 2 mg  2 mg Intramuscular TID PRN Jearld Lesch, NP       hydrOXYzine (ATARAX) tablet 25 mg  25 mg Oral TID PRN Jearld Lesch, NP   25 mg at 05/13/23 1501   ibuprofen (ADVIL) tablet 600 mg  600 mg Oral Q8H PRN Myriam Forehand, NP   600 mg at 05/15/23 0911   isosorbide mononitrate (IMDUR) 24 hr tablet 30 mg  30 mg Oral Daily Myriam Forehand, NP   30 mg at 05/15/23 9604   magnesium hydroxide (MILK OF MAGNESIA) suspension 30 mL  30 mL Oral Daily PRN Jearld Lesch, NP       melatonin tablet 2.5 mg  2.5 mg Oral QHS Myriam Forehand, NP   2.5 mg at 05/14/23 2117   metoprolol succinate (TOPROL-XL) 24 hr tablet 12.5 mg  12.5 mg Oral Daily Myriam Forehand, NP   12.5 mg at 05/15/23 5409   nicotine (NICODERM CQ - dosed in mg/24 hours) patch 14 mg  14 mg Transdermal Daily Lerry Liner M, NP   14 mg at 05/15/23 0916   rosuvastatin (CRESTOR) tablet 40 mg  40 mg Oral Daily Myriam Forehand, NP   40 mg at 05/14/23 2117   topiramate (TOPAMAX) tablet 50 mg  50 mg Oral Daily Myriam Forehand, NP   50 mg at 05/15/23 0909   traZODone (DESYREL) tablet 50 mg  50 mg Oral QHS PRN Jearld Lesch, NP   50 mg at 05/13/23 2133   venlafaxine XR (EFFEXOR-XR) 24 hr capsule 75 mg  75 mg Oral Q breakfast Myriam Forehand, NP   75 mg at 05/15/23 8119    Lab  Results:  Results for orders placed or performed during the hospital encounter of 05/13/23 (from the past 48 hours)  Lipid panel     Status: Abnormal   Collection Time: 05/14/23  9:30 AM  Result Value Ref Range   Cholesterol 202 (H) 0 - 200 mg/dL   Triglycerides 87 <621 mg/dL   HDL 53 >30 mg/dL   Total CHOL/HDL Ratio 3.8 RATIO   VLDL 17 0 - 40 mg/dL   LDL Cholesterol 865 (H) 0 - 99 mg/dL    Comment:        Total Cholesterol/HDL:CHD Risk Coronary Heart Disease Risk Table                     Men   Women  1/2 Average Risk   3.4   3.3  Average Risk       5.0   4.4  2 X Average Risk   9.6   7.1  3 X Average Risk  23.4   11.0        Use the calculated Patient Ratio above and the CHD Risk Table to determine the patient's CHD Risk.        ATP III CLASSIFICATION (LDL):  <100     mg/dL   Optimal  784-696  mg/dL   Near or Above                    Optimal  130-159  mg/dL   Borderline  295-284  mg/dL   High  >132     mg/dL   Very High Performed at [facility], [address] [place], [place] [address]     Blood Alcohol level:  Lab Results  Component Value Date   [facility] <10 05/12/2023    Metabolic Disorder Labs: No results found for: "HGBA1C", "MPG" No results found for: "PROLACTIN" Lab Results  Component Value Date   CHOL 202 (H) 05/14/2023   TRIG 87 05/14/2023   HDL 53 05/14/2023   CHOLHDL 3.8 05/14/2023   VLDL 17 05/14/2023   LDLCALC 132 (H) 05/14/2023    Physical Findings: AIMS:  , ,  ,  ,    CIWA:    COWS:     Musculoskeletal: Strength & Muscle Tone: within normal limits Gait & Station: normal Patient leans: N/A  Psychiatric Specialty Exam:  Presentation  General Appearance:  Appropriate for Environment; Neat  Eye Contact: Good  Speech: Clear and Coherent  Speech Volume: Increased  Handedness: Right   Mood and Affect  Mood: Anxious; Irritable  Affect: Inappropriate   Thought Process  Thought  Processes: Coherent  Descriptions of Associations:Loose  Orientation:Full (Time, Place and Person)  Thought Content:Tangential  History of Schizophrenia/Schizoaffective disorder:No  Duration of Psychotic Symptoms:N/A  Hallucinations:Hallucinations: None  Ideas of Reference:None  Suicidal Thoughts:Suicidal Thoughts: No  Homicidal Thoughts:Homicidal Thoughts: No   Sensorium  Memory: Remote Good; Immediate Good  Judgment: Fair  Insight: Fair   Art therapist  Concentration: Good  Attention Span: Fair  Recall: Good  Fund of Knowledge: Good  Language: Good   Psychomotor Activity  Psychomotor Activity: Psychomotor Activity: Normal   Assets  Assets: Communication Skills; Financial Resources/Insurance   Sleep  Sleep: Number of Hours of Sleep: 6    Physical Exam: Physical Exam Vitals and nursing note reviewed.  Constitutional:      Appearance: Normal appearance.  HENT:     Head: Normocephalic and atraumatic.     Nose: Nose normal.  Pulmonary:  Effort: Pulmonary effort is normal.  Musculoskeletal:        General: Normal range of motion.     Cervical back: Normal range of motion.  Neurological:     General: No focal deficit present.     Mental Status: She is alert and oriented to person, place, and time. Mental status is at baseline.  Psychiatric:        Attention and Perception: Attention and perception normal.        Mood and Affect: Mood is anxious. Affect is flat.        Speech: Speech is rapid and pressured.        Behavior: Behavior is hyperactive.        Cognition and Memory: Cognition and memory normal.        Judgment: Judgment is impulsive.    Review of Systems  Psychiatric/Behavioral:  The patient is nervous/anxious.   All other systems reviewed and are negative.  Blood pressure 123/78, pulse 81, temperature 98.2 F (36.8 C), resp. rate 20, height 5' 3.25" (1.607 m), weight 72.1 kg, SpO2 99%. Body mass index is  27.94 kg/m.   Treatment Plan Summary: Daily contact with patient to assess and evaluate symptoms and progress in treatment and Medication management Topiramate 50 mg Daily Mood Stabilization  Effexor (Venlafaxine) 24 mg Depression, anxiety, or panic disorders Crestor (Rosuvastatin) Dyslipidemia, cardiovascular disease prevention.  Plavix (Clopidogrel) Prevention of thrombotic events in patients with atherosclerosis, post-MI, or post-stent placement. Melatonin 5 nightly sleep disturbances are present,  a safer option for patients with a cardiovascular history. Buspirone (BuSpar): Generalized anxiety disorder. Provide supportive therapy to assist with coping and emotional regulation.   Myriam Forehand, NP 05/15/2023, 6:22 PM

## 2023-05-14 NOTE — Plan of Care (Signed)
  Problem: Education: Goal: Knowledge of [place] [facility] Education information/materials will improve Outcome: Progressing Goal: Emotional status will improve Outcome: Progressing   Problem: Activity: Goal: Interest or engagement in activities will improve Outcome: Progressing Goal: Sleeping patterns will improve Outcome: Progressing   Problem: Coping: Goal: Ability to verbalize frustrations and anger appropriately will improve Outcome: Progressing Goal: Ability to demonstrate self-control will improve Outcome: Progressing   Problem: Health Behavior/Discharge Planning: Goal: Identification of resources available to assist in meeting health care needs will improve Outcome: Progressing Goal: Compliance with treatment plan for underlying cause of condition will improve Outcome: Progressing

## 2023-05-15 DIAGNOSIS — F23 Brief psychotic disorder: Secondary | ICD-10-CM | POA: Diagnosis not present

## 2023-05-15 NOTE — Group Note (Signed)
Date:  05/15/2023 Time:  10:46 PM  Group Topic/Focus:  Wrap-Up Group:   The focus of this group is to help patients review their daily goal of treatment and discuss progress on daily workbooks.    Participation Level:  Active  Participation Quality:  Appropriate  Affect:  Appropriate  Cognitive:  Alert  Insight: Appropriate  Engagement in Group:  Engaged  Modes of Intervention:  Discussion  Additional Comments:  Pt stated that she worked on her group packet from Friday and other self care.  Tacy Dura 05/15/2023, 10:46 PM

## 2023-05-15 NOTE — Plan of Care (Signed)
  Problem: Activity: Goal: Interest or engagement in activities will improve Outcome: Progressing   Problem: Physical Regulation: Goal: Ability to maintain clinical measurements within normal limits will improve Outcome: Progressing   Problem: Safety: Goal: Periods of time without injury will increase Outcome: Progressing

## 2023-05-15 NOTE — Group Note (Signed)
Date:  05/15/2023 Time:  11:47 AM  Group Topic/Focus:  Spirituality:   The focus of this group is to discuss how one's spirituality can aide in recovery.    Participation Level:  Active  Participation Quality:  Appropriate  Affect:  Appropriate  Cognitive:  Appropriate  Insight: Appropriate  Engagement in Group:  Engaged  Modes of Intervention:  Activity  Additional Comments:    Mary Sella Mardie Kellen 05/15/2023, 11:47 AM
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
## 2023-05-15 NOTE — Group Note (Signed)
Date:  05/15/2023 Time:  11:39 PM  Group Topic/Focus:  Wrap-Up Group:   The focus of this group is to help patients review their daily goal of treatment and discuss progress on daily workbooks.    Participation Level:  Active  Participation Quality:  Appropriate  Affect:  Appropriate  Cognitive:  Appropriate  Insight: Appropriate  Engagement in Group:  Engaged  Modes of Intervention:  Discussion  Additional Comments:  Pt said she focused on self care today and worked on Friday group packet.  Tacy Dura 05/15/2023, 11:39 PM

## 2023-05-15 NOTE — [facility] Suicide Risk Assessment (Signed)
[facility] INPATIENT:  Family/Significant Other Suicide Prevention Education  Suicide Prevention Education:  Education Completed; marcus blu, ex boyfriend, [id number],  has been identified by the patient as the family member/significant other with whom the patient will be residing, and identified as the person(s) who will aid the patient in the event of a mental health crisis (suicidal ideations/suicide attempt).  With written consent from the patient, the family member/significant other has been provided the following suicide prevention education, prior to the and/or following the discharge of the patient.  The suicide prevention education provided includes the following: Suicide risk factors Suicide prevention and interventions National Suicide Hotline telephone number [facility] assessment telephone number [facility] Emergency Assistance 911 [facility] and/or Residential Mobile Crisis Unit telephone number  Request made of family/significant other to: Remove weapons (e.g., guns, rifles, knives), all items previously/currently identified as safety concern.   Remove drugs/medications (over-the-counter, prescriptions, illicit drugs), all items previously/currently identified as a safety concern.  The family member/significant other verbalizes understanding of the suicide prevention education information provided.  The family member/significant other agrees to remove the items of safety concern listed above.  The LCSWA contacted the patient ex boyfriend. The patient ex informed the LCSWA that he would contact the patient son because the patient did not have any numbers memorized. The LCSWA provided him with SPI.    Marshell Levan 05/15/2023, 1:34 PM

## 2023-05-15 NOTE — Progress Notes (Signed)
[facility] MD Progress Note  05/15/2023 6:44 PM Madeline [name]  MRN:  [id number] Subjective:   47 year old Caucasian female who reports, "feeling a lot better." She has been interacting appropriately with staff and actively participating in group therapy sessions. The patient denies suicidal ideation (SI), homicidal ideation (HI), or auditory/visual hallucinations (AVH). She expresses a positive outlook and appears more engaged in her care Principal Problem: Acute psychosis (HCC) Diagnosis: Principal Problem:   Acute psychosis (HCC) Active Problems:   Marijuana smoker, continuous  Total Time spent with patient: 1.5 hours  Past Psychiatric History: see below  Past Medical History:  Past Medical History:  Diagnosis Date   Fibromyalgia    PTSD (post-traumatic stress disorder)    Pyelonephritis     Past Surgical History:  Procedure Laterality Date   CHOLECYSTECTOMY     LEFT HEART CATH AND CORONARY ANGIOGRAPHY Left 04/13/2023   Procedure: LEFT HEART CATH AND CORONARY ANGIOGRAPHY;  Surgeon: Alwyn Pea, MD;  Location: [facility] INVASIVE CV LAB;  Service: Cardiovascular;  Laterality: Left;   TUBAL LIGATION     Family History: History reviewed. No pertinent family history. Family Psychiatric  History: none reported Social History:  Social History   Substance and Sexual Activity  Alcohol Use Yes   Comment: occasionally     Social History   Substance and Sexual Activity  Drug Use Yes   Types: Marijuana    Social History   Socioeconomic History   Marital status: Legally Separated    Spouse name: Not on file   Number of children: Not on file   Years of education: Not on file   Highest education level: Not on file  Occupational History   Not on file  Tobacco Use   Smoking status: Every Day    Current packs/day: 0.50    Types: Cigarettes   Smokeless tobacco: Never  Vaping Use   Vaping status: Every Day  Substance and Sexual Activity   Alcohol use: Yes    Comment:  occasionally   Drug use: Yes    Types: Marijuana   Sexual activity: Not Currently  Other Topics Concern   Not on file  Social History Narrative   Not on file   Social Drivers of Health   Financial Resource Strain: Not on File (06/04/2021)   Received from [facility]    Financial Resource Strain: 0  Food Insecurity: Patient Declined (05/13/2023)   Hunger Vital Sign    Worried About Running Out of Food in the Last Year: Patient declined    Ran Out of Food in the Last Year: Patient declined  Transportation Needs: No Transportation Needs (05/13/2023)   PRAPARE - Administrator, Civil Service (Medical): No    Lack of Transportation (Non-Medical): No  Physical Activity: Not on File (06/04/2021)   Received from [place], [place]   Physical Activity    Physical Activity: 0  Stress: Not on File (06/04/2021)   Received from [facility], [place]   Stress    Stress: 0  Social Connections: Not on File (02/12/2023)   Received from [facility]   Social Connections    Connectedness: 0   Additional Social History:                         Sleep: Good  Appetite:  Good  Current Medications: Current Facility-Administered Medications  Medication Dose Route Frequency Provider Last Rate Last Admin   acetaminophen (TYLENOL) tablet 650 mg  650 mg Oral Q6H PRN Jearld Lesch, NP       alum & mag hydroxide-simeth (MAALOX/MYLANTA) 200-200-20 MG/5ML suspension 30 mL  30 mL Oral Q4H PRN Jearld Lesch, NP       aspirin EC tablet 81 mg  81 mg Oral Daily Myriam Forehand, NP   81 mg at 05/15/23 0909   busPIRone (BUSPAR) tablet 10 mg  10 mg Oral BID Myriam Forehand, NP   10 mg at 05/15/23 1708   clopidogrel (PLAVIX) tablet 75 mg  75 mg Oral Daily Myriam Forehand, NP   75 mg at 05/15/23 1610   haloperidol (HALDOL) tablet 5 mg  5 mg Oral TID PRN Jearld Lesch, NP       And   diphenhydrAMINE (BENADRYL) capsule 50 mg  50 mg Oral TID PRN Jearld Lesch, NP       haloperidol  lactate (HALDOL) injection 5 mg  5 mg Intramuscular TID PRN Jearld Lesch, NP       And   diphenhydrAMINE (BENADRYL) injection 50 mg  50 mg Intramuscular TID PRN Jearld Lesch, NP       And   LORazepam (ATIVAN) injection 2 mg  2 mg Intramuscular TID PRN Jearld Lesch, NP       haloperidol lactate (HALDOL) injection 10 mg  10 mg Intramuscular TID PRN Jearld Lesch, NP       And   diphenhydrAMINE (BENADRYL) injection 50 mg  50 mg Intramuscular TID PRN Jearld Lesch, NP       And   LORazepam (ATIVAN) injection 2 mg  2 mg Intramuscular TID PRN Jearld Lesch, NP       hydrOXYzine (ATARAX) tablet 25 mg  25 mg Oral TID PRN Jearld Lesch, NP   25 mg at 05/13/23 1501   ibuprofen (ADVIL) tablet 600 mg  600 mg Oral Q8H PRN Myriam Forehand, NP   600 mg at 05/15/23 0911   isosorbide mononitrate (IMDUR) 24 hr tablet 30 mg  30 mg Oral Daily Myriam Forehand, NP   30 mg at 05/15/23 9604   magnesium hydroxide (MILK OF MAGNESIA) suspension 30 mL  30 mL Oral Daily PRN Jearld Lesch, NP       melatonin tablet 2.5 mg  2.5 mg Oral QHS Myriam Forehand, NP   2.5 mg at 05/14/23 2117   metoprolol succinate (TOPROL-XL) 24 hr tablet 12.5 mg  12.5 mg Oral Daily Myriam Forehand, NP   12.5 mg at 05/15/23 5409   nicotine (NICODERM CQ - dosed in mg/24 hours) patch 14 mg  14 mg Transdermal Daily Lerry Liner M, NP   14 mg at 05/15/23 0916   rosuvastatin (CRESTOR) tablet 40 mg  40 mg Oral Daily Myriam Forehand, NP   40 mg at 05/14/23 2117   topiramate (TOPAMAX) tablet 50 mg  50 mg Oral Daily Myriam Forehand, NP   50 mg at 05/15/23 0909   traZODone (DESYREL) tablet 50 mg  50 mg Oral QHS PRN Jearld Lesch, NP   50 mg at 05/13/23 2133   venlafaxine XR (EFFEXOR-XR) 24 hr capsule 75 mg  75 mg Oral Q breakfast Myriam Forehand, NP   75 mg at 05/15/23 8119    Lab Results:  Results for orders placed or performed during the hospital encounter of 05/13/23 (from the past 48 hours)  Lipid panel     Status: Abnormal  Collection Time: 05/14/23  9:30 AM  Result Value Ref Range   Cholesterol 202 (H) 0 - 200 mg/dL   Triglycerides 87 <914 mg/dL   HDL 53 >78 mg/dL   Total CHOL/HDL Ratio 3.8 RATIO   VLDL 17 0 - 40 mg/dL   LDL Cholesterol 295 (H) 0 - 99 mg/dL    Comment:        Total Cholesterol/HDL:CHD Risk Coronary Heart Disease Risk Table                     Men   Women  1/2 Average Risk   3.4   3.3  Average Risk       5.0   4.4  2 X Average Risk   9.6   7.1  3 X Average Risk  23.4   11.0        Use the calculated Patient Ratio above and the CHD Risk Table to determine the patient's CHD Risk.        ATP III CLASSIFICATION (LDL):  <100     mg/dL   Optimal  621-308  mg/dL   Near or Above                    Optimal  130-159  mg/dL   Borderline  657-846  mg/dL   High  >962     mg/dL   Very High Performed at [facility], [address] [place], [place] [address]     Blood Alcohol level:  Lab Results  Component Value Date   [facility] <10 05/12/2023    Metabolic Disorder Labs: No results found for: "HGBA1C", "MPG" No results found for: "PROLACTIN" Lab Results  Component Value Date   CHOL 202 (H) 05/14/2023   TRIG 87 05/14/2023   HDL 53 05/14/2023   CHOLHDL 3.8 05/14/2023   VLDL 17 05/14/2023   LDLCALC 132 (H) 05/14/2023    Physical Findings: AIMS:  , ,  ,  ,    CIWA:    COWS:     Musculoskeletal: Strength & Muscle Tone: within normal limits Gait & Station: normal Patient leans: N/A  Psychiatric Specialty Exam:  Presentation  General Appearance:  Appropriate for Environment; Neat  Eye Contact: Good  Speech: Clear and Coherent  Speech Volume: Decreased  Handedness: Right   Mood and Affect  Mood: Irritable; Anxious  Affect: Inappropriate   Thought Process  Thought Processes: Coherent  Descriptions of Associations:Intact  Orientation:Full (Time, Place and Person)  Thought Content:Tangential  History of Schizophrenia/Schizoaffective  disorder:No  Duration of Psychotic Symptoms:N/A  Hallucinations:Hallucinations: None  Ideas of Reference:None  Suicidal Thoughts:Suicidal Thoughts: No  Homicidal Thoughts:Homicidal Thoughts: No   Sensorium  Memory: Immediate Good; Remote Good  Judgment: Fair  Insight: Poor   Executive Functions  Concentration: Fair  Attention Span: Fair  Recall: Good  Fund of Knowledge: Good  Language: Good   Psychomotor Activity  Psychomotor Activity: Psychomotor Activity: Normal   Assets  Assets: Communication Skills; Financial Resources/Insurance; Housing   Sleep  Sleep: Sleep: Good Number of Hours of Sleep: 7    Physical Exam: Physical Exam Vitals and nursing note reviewed.  Constitutional:      Appearance: Normal appearance.  HENT:     Head: Normocephalic and atraumatic.     Nose: Nose normal.  Pulmonary:     Effort: Pulmonary effort is normal.  Musculoskeletal:        General: Normal range of motion.     Cervical back:  Normal range of motion.  Neurological:     General: No focal deficit present.     Mental Status: She is alert. Mental status is at baseline.  Psychiatric:        Attention and Perception: Attention and perception normal.        Mood and Affect: Mood is anxious. Affect is flat.        Speech: Speech normal.        Behavior: Behavior is hyperactive. Behavior is cooperative.        Thought Content: Thought content normal.        Cognition and Memory: Cognition and memory normal.        Judgment: Judgment normal.    Review of Systems  Psychiatric/Behavioral:  The patient is nervous/anxious.   All other systems reviewed and are negative.  Blood pressure 123/78, pulse 81, temperature 98.2 F (36.8 C), resp. rate 20, height 5' 3.25" (1.607 m), weight 72.1 kg, SpO2 99%. Body mass index is 27.94 kg/m.   Treatment Plan Summary: Daily contact with patient to assess and evaluate symptoms and progress in treatment and Medication  management Topiramate 50 mg Daily Mood Stabilization  Effexor (Venlafaxine) 24 mg Depression, anxiety, or panic disorders Crestor (Rosuvastatin) Dyslipidemia, cardiovascular disease prevention.  Plavix (Clopidogrel) Prevention of thrombotic events in patients with atherosclerosis, post-MI, or post-stent placement. Melatonin 5 nightly sleep disturbances are present,  a safer option for patients with a cardiovascular history. Buspirone (BuSpar): Generalized anxiety disorder. Provide supportive therapy to assist with coping and emotional regulation. Myriam Forehand, NP 05/15/2023, 6:44 PM

## 2023-05-16 DIAGNOSIS — F23 Brief psychotic disorder: Secondary | ICD-10-CM | POA: Diagnosis not present

## 2023-05-16 MED ORDER — TOPIRAMATE 50 MG PO TABS
50.0000 mg | ORAL_TABLET | Freq: Every day | ORAL | 0 refills | Status: DC
  Filled 2023-05-16: qty 30, 30d supply, fill #0

## 2023-05-16 MED ORDER — HYDROXYZINE HCL 25 MG PO TABS
25.0000 mg | ORAL_TABLET | Freq: Three times a day (TID) | ORAL | 0 refills | Status: AC | PRN
  Filled 2023-05-16: qty 30, 10d supply, fill #0

## 2023-05-16 MED ORDER — MELATONIN 5 MG PO TABS
2.5000 mg | ORAL_TABLET | Freq: Every evening | ORAL | 0 refills | Status: AC | PRN
  Filled 2023-05-16: qty 15, 30d supply, fill #0

## 2023-05-16 MED ORDER — NICOTINE 14 MG/24HR TD PT24
14.0000 mg | MEDICATED_PATCH | Freq: Every day | TRANSDERMAL | 0 refills | Status: AC
  Filled 2023-05-16: qty 84, 84d supply, fill #0

## 2023-05-16 MED ORDER — METOPROLOL SUCCINATE ER 25 MG PO TB24
12.5000 mg | ORAL_TABLET | Freq: Every day | ORAL | 0 refills | Status: DC
  Filled 2023-05-16: qty 15, 30d supply, fill #0

## 2023-05-16 MED ORDER — CLOPIDOGREL BISULFATE 75 MG PO TABS
75.0000 mg | ORAL_TABLET | Freq: Every day | ORAL | 0 refills | Status: AC
  Filled 2023-05-16: qty 30, 30d supply, fill #0

## 2023-05-16 MED ORDER — ISOSORBIDE MONONITRATE ER 30 MG PO TB24
30.0000 mg | ORAL_TABLET | Freq: Every day | ORAL | 0 refills | Status: DC
  Filled 2023-05-16: qty 30, 30d supply, fill #0

## 2023-05-16 MED ORDER — ASPIRIN 81 MG PO TBEC
81.0000 mg | DELAYED_RELEASE_TABLET | Freq: Every day | ORAL | 0 refills | Status: AC
  Filled 2023-05-16: qty 90, 90d supply, fill #0

## 2023-05-16 MED ORDER — IBUPROFEN 600 MG PO TABS
600.0000 mg | ORAL_TABLET | Freq: Three times a day (TID) | ORAL | 0 refills | Status: AC | PRN
  Filled 2023-05-16: qty 30, 10d supply, fill #0

## 2023-05-16 MED ORDER — VENLAFAXINE HCL ER 75 MG PO CP24
75.0000 mg | ORAL_CAPSULE | Freq: Every day | ORAL | 0 refills | Status: DC
  Filled 2023-05-16: qty 30, 30d supply, fill #0

## 2023-05-16 NOTE — Group Note (Signed)
Recreation Therapy Group Note   Group Topic:Coping Skills  Group Date: 05/16/2023 Start Time: 1000 End Time: 1100 Facilitators: Rosina Lowenstein, LRT, CTRS Location:  Craft Room  Group Description: Mind Map.  Patient was provided a blank template of a diagram with 32 blank boxes in a tiered system, branching from the center (similar to a bubble chart). LRT directed patients to label the middle of the diagram "Coping Skills". LRT and patients then came up with 8 different coping skills as examples. Pt were directed to record their coping skills in the 2nd tier boxes closest to the center.  Patients would then share their coping skills with the group as LRT wrote them out. LRT gave a handout of 99 different coping skills at the end of group.   Goal Area(s) Addressed: Patients will be able to define "coping skills". Patient will identify new coping skills.  Patient will increase communication.   Affect/Mood: Elevated and Full range   Participation Level: Hyperverbal   Participation Quality: Minimal Cues   Behavior: Alert and Distracted   Speech/Thought Process: Flight of ideas and Loose association   Insight: Limited   Judgement: Limited   Modes of Intervention: Education, Exploration, Guided Discussion, and Worksheet   Patient Response to Interventions:  Receptive   Education Outcome:  In group clarification offered    Clinical Observations/Individualized Feedback: Madeline [name] was somewhat active in their participation of session activities and group discussion. Pt identified "write, orchestrate, create, produce music" as coping skills. Pt would begin talking and go off on tangents, mainly about her kids and how she regrets moving here. Pt talked some about music and her family. Pt talks very quickly about many different things.    Plan: Continue to engage patient in RT group sessions 2-3x/week.   Rosina Lowenstein, LRT, CTRS 05/16/2023 1:07 PM

## 2023-05-16 NOTE — Progress Notes (Signed)
[facility] MD Progress Note  05/16/2023 6:40 PM Madeline [name]  MRN:  [id number] Subjective:   47 year old Caucasian female who reports, "I am trying not to go back to that house. I will lose if I have to go back." She denies suicidal ideation (SI), homicidal ideation (HI), and auditory/visual hallucinations (AVH). The patient expresses significant distress about her home environment, which she perceives as a source of emotional difficulty.he patient is experiencing distress related to her home environment, which she perceives as triggering and potentially destabilizing. While she denies SI, HI, and AVH, the emotional strain may require additional support and planning to ensure stability post-discharge. Principal Problem: Acute psychosis (HCC) Diagnosis: Principal Problem:   Acute psychosis (HCC) Active Problems:   Marijuana smoker, continuous  Total Time spent with patient: 45 minutes  Past Psychiatric History: see below  Past Medical History:  Past Medical History:  Diagnosis Date   Fibromyalgia    PTSD (post-traumatic stress disorder)    Pyelonephritis     Past Surgical History:  Procedure Laterality Date   CHOLECYSTECTOMY     LEFT HEART CATH AND CORONARY ANGIOGRAPHY Left 04/13/2023   Procedure: LEFT HEART CATH AND CORONARY ANGIOGRAPHY;  Surgeon: Alwyn Pea, MD;  Location: [facility] INVASIVE CV LAB;  Service: Cardiovascular;  Laterality: Left;   TUBAL LIGATION     Family History: History reviewed. No pertinent family history. Family Psychiatric  History: none reported Social History:  Social History   Substance and Sexual Activity  Alcohol Use Yes   Comment: occasionally     Social History   Substance and Sexual Activity  Drug Use Yes   Types: Marijuana    Social History   Socioeconomic History   Marital status: Legally Separated    Spouse name: Not on file   Number of children: Not on file   Years of education: Not on file   Highest education level: Not on file   Occupational History   Not on file  Tobacco Use   Smoking status: Every Day    Current packs/day: 0.50    Types: Cigarettes   Smokeless tobacco: Never  Vaping Use   Vaping status: Every Day  Substance and Sexual Activity   Alcohol use: Yes    Comment: occasionally   Drug use: Yes    Types: Marijuana   Sexual activity: Not Currently  Other Topics Concern   Not on file  Social History Narrative   Not on file   Social Drivers of Health   Financial Resource Strain: Not on File (06/04/2021)   Received from [employer] [employer]    Financial Resource Strain: 0  Food Insecurity: Patient Declined (05/13/2023)   Hunger Vital Sign    Worried About Running Out of Food in the Last Year: Patient declined    Ran Out of Food in the Last Year: Patient declined  Transportation Needs: No Transportation Needs (05/13/2023)   PRAPARE - Administrator, Civil Service (Medical): No    Lack of Transportation (Non-Medical): No  Physical Activity: Not on File (06/04/2021)   Received from [place], [place]   Physical Activity    Physical Activity: 0  Stress: Not on File (06/04/2021)   Received from [facility], [place]   Stress    Stress: 0  Social Connections: Not on File (02/12/2023)   Received from [facility]   Social Connections    Connectedness: 0   Additional Social History:  Sleep: Good  Appetite:  Good  Current Medications: Current Facility-Administered Medications  Medication Dose Route Frequency Provider Last Rate Last Admin   acetaminophen (TYLENOL) tablet 650 mg  650 mg Oral Q6H PRN Dixon, Rashaun M, NP       alum & mag hydroxide-simeth (MAALOX/MYLANTA) 200-200-20 MG/5ML suspension 30 mL  30 mL Oral Q4H PRN Jearld Lesch, NP       aspirin EC tablet 81 mg  81 mg Oral Daily Myriam Forehand, NP   81 mg at 05/16/23 0941   busPIRone (BUSPAR) tablet 10 mg  10 mg Oral BID Myriam Forehand, NP   10 mg at 05/16/23 1801   clopidogrel (PLAVIX)  tablet 75 mg  75 mg Oral Daily Myriam Forehand, NP   75 mg at 05/16/23 1610   haloperidol (HALDOL) tablet 5 mg  5 mg Oral TID PRN Jearld Lesch, NP       And   diphenhydrAMINE (BENADRYL) capsule 50 mg  50 mg Oral TID PRN Jearld Lesch, NP       haloperidol lactate (HALDOL) injection 5 mg  5 mg Intramuscular TID PRN Jearld Lesch, NP       And   diphenhydrAMINE (BENADRYL) injection 50 mg  50 mg Intramuscular TID PRN Jearld Lesch, NP       And   LORazepam (ATIVAN) injection 2 mg  2 mg Intramuscular TID PRN Jearld Lesch, NP       haloperidol lactate (HALDOL) injection 10 mg  10 mg Intramuscular TID PRN Jearld Lesch, NP       And   diphenhydrAMINE (BENADRYL) injection 50 mg  50 mg Intramuscular TID PRN Jearld Lesch, NP       And   LORazepam (ATIVAN) injection 2 mg  2 mg Intramuscular TID PRN Jearld Lesch, NP       hydrOXYzine (ATARAX) tablet 25 mg  25 mg Oral TID PRN Jearld Lesch, NP   25 mg at 05/13/23 1501   ibuprofen (ADVIL) tablet 600 mg  600 mg Oral Q8H PRN Myriam Forehand, NP   600 mg at 05/16/23 1436   isosorbide mononitrate (IMDUR) 24 hr tablet 30 mg  30 mg Oral Daily Myriam Forehand, NP   30 mg at 05/16/23 9604   magnesium hydroxide (MILK OF MAGNESIA) suspension 30 mL  30 mL Oral Daily PRN Jearld Lesch, NP       melatonin tablet 2.5 mg  2.5 mg Oral QHS Myriam Forehand, NP   2.5 mg at 05/15/23 2118   metoprolol succinate (TOPROL-XL) 24 hr tablet 12.5 mg  12.5 mg Oral Daily Myriam Forehand, NP   12.5 mg at 05/16/23 5409   nicotine (NICODERM CQ - dosed in mg/24 hours) patch 14 mg  14 mg Transdermal Daily Lerry Liner M, NP   14 mg at 05/16/23 0941   rosuvastatin (CRESTOR) tablet 40 mg  40 mg Oral Daily Myriam Forehand, NP   40 mg at 05/15/23 2118   topiramate (TOPAMAX) tablet 50 mg  50 mg Oral Daily Myriam Forehand, NP   50 mg at 05/16/23 8119   traZODone (DESYREL) tablet 50 mg  50 mg Oral QHS PRN Jearld Lesch, NP   50 mg at 05/13/23 2133   venlafaxine XR  (EFFEXOR-XR) 24 hr capsule 75 mg  75 mg Oral Q breakfast Myriam Forehand, NP   75 mg at 05/16/23 [phone]  Lab Results: No results found for this or any previous visit (from the past 48 hours).  Blood Alcohol level:  Lab Results  Component Value Date   ETH <10 05/12/2023    Metabolic Disorder Labs: No results found for: "HGBA1C", "MPG" No results found for: "PROLACTIN" Lab Results  Component Value Date   CHOL 202 (H) 05/14/2023   TRIG 87 05/14/2023   HDL 53 05/14/2023   CHOLHDL 3.8 05/14/2023   VLDL 17 05/14/2023   LDLCALC 132 (H) 05/14/2023    Physical Findings: AIMS:  , ,  ,  ,    CIWA:    COWS:     Musculoskeletal: Strength & Muscle Tone: within normal limits Gait & Station: normal Patient leans: N/A  Psychiatric Specialty Exam:  Presentation  General Appearance:  Appropriate for Environment  Eye Contact: Good  Speech: Clear and Coherent; Normal Rate  Speech Volume: Normal  Handedness: Right   Mood and Affect  Mood: Anxious; Irritable  Affect: Flat   Thought Process  Thought Processes: Disorganized  Descriptions of Associations:Loose  Orientation:Partial  Thought Content:Illogical  History of Schizophrenia/Schizoaffective disorder:No  Duration of Psychotic Symptoms:Greater than six months  Hallucinations:Hallucinations: None  Ideas of Reference:None  Suicidal Thoughts:Suicidal Thoughts: No  Homicidal Thoughts:Homicidal Thoughts: No   Sensorium  Memory: Immediate Fair; Remote Fair  Judgment: Poor  Insight: Poor   Executive Functions  Concentration: Fair  Attention Span: Fair  Recall: Fair  Fund of Knowledge: Good  Language: Good   Psychomotor Activity  Psychomotor Activity: Psychomotor Activity: Normal   Assets  Assets: Communication Skills; Financial Resources/Insurance   Sleep  Sleep: Sleep: Good Number of Hours of Sleep: 7    Physical Exam: Physical Exam Vitals and nursing note  reviewed.  Constitutional:      Appearance: Normal appearance.  HENT:     Head: Normocephalic and atraumatic.     Nose: Nose normal.  Pulmonary:     Effort: Pulmonary effort is normal.  Musculoskeletal:        General: Normal range of motion.     Cervical back: Normal range of motion.  Neurological:     General: No focal deficit present.     Mental Status: She is alert. Mental status is at baseline.  Psychiatric:        Attention and Perception: Attention and perception normal.        Mood and Affect: Mood is anxious. Affect is flat.        Speech: Speech normal.        Behavior: Behavior normal. Behavior is cooperative.        Thought Content: Thought content is delusional.        Cognition and Memory: Cognition is impaired.        Judgment: Judgment is impulsive.    ROS Blood pressure (!) 134/93, pulse 82, temperature 98.4 F (36.9 C), resp. rate 20, height 5' 3.25" (1.607 m), weight 72.1 kg, SpO2 98%. Body mass index is 27.94 kg/m.   Treatment Plan Summary: Daily contact with patient to assess and evaluate symptoms and progress in treatment and Medication management Topiramate 50 mg Daily Mood Stabilization  Effexor (Venlafaxine) 24 mg Depression, anxiety, or panic disorders Buspar 10mg  BID Crestor (Rosuvastatin) Dyslipidemia, cardiovascular disease prevention.  Plavix (Clopidogrel) Prevention of thrombotic events in patients with atherosclerosis, post-MI, or post-stent placement. Melatonin 5 nightly sleep disturbances are present,  a safer option for patients with a cardiovascular history. Buspirone (BuSpar): Generalized anxiety disorder. Provide supportive therapy to assist  with coping and emotional regulation Myriam Forehand, NP 05/16/2023, 6:40 PM

## 2023-05-16 NOTE — Group Note (Signed)
[facility] LCSW Group Therapy Note    Group Date: 05/16/2023 Start Time: 1315 End Time: 1415  Type of Therapy and Topic:  Group Therapy:  Overcoming Obstacles  Participation Level:  [facility] PARTICIPATION LEVEL: Active  Mood:  Description of Group:   In this group patients will be encouraged to explore what they see as obstacles to their own wellness and recovery. They will be guided to discuss their thoughts, feelings, and behaviors related to these obstacles. The group will process together ways to cope with barriers, with attention given to specific choices patients can make. Each patient will be challenged to identify changes they are motivated to make in order to overcome their obstacles. This group will be process-oriented, with patients participating in exploration of their own experiences as well as giving and receiving support and challenge from other group members.  Therapeutic Goals: 1. Patient will identify personal and current obstacles as they relate to admission. 2. Patient will identify barriers that currently interfere with their wellness or overcoming obstacles.  3. Patient will identify feelings, thought process and behaviors related to these barriers. 4. Patient will identify two changes they are willing to make to overcome these obstacles:    Summary of Patient Progress Patient was present in group.  Patient required redirection as patient was disruptive and causing difficulty for others in group.  Patient would often state that she was "acting" and CSW had to redirect again.    Therapeutic Modalities:   Cognitive Behavioral Therapy Solution Focused Therapy Motivational Interviewing Relapse Prevention Therapy   Harden Mo, LCSW

## 2023-05-16 NOTE — Progress Notes (Signed)
   05/16/23 0841  Psych Admission Type (Psych Patients Only)  Admission Status Involuntary  Psychosocial Assessment  Patient Complaints Anxiety  Eye Contact Brief  Facial Expression Anxious  Affect Appropriate to circumstance  Speech Logical/coherent  Interaction Assertive  Motor Activity Fidgety  Appearance/Hygiene Unremarkable  Behavior Characteristics Cooperative  Mood Anxious  Aggressive Behavior  Effect No apparent injury  Thought Process  Coherency WDL  Content WDL  Delusions None reported or observed  Perception WDL  Hallucination None reported or observed  Judgment WDL  Confusion WDL  Danger to Self  Current suicidal ideation? Denies  Agreement Not to Harm Self Yes  Description of Agreement verbal  Danger to Others  Danger to Others None reported or observed

## 2023-05-16 NOTE — Group Note (Signed)
Date:  05/16/2023 Time:  9:50 PM  Group Topic/Focus:  Wrap-Up Group:   The focus of this group is to help patients review their daily goal of treatment and discuss progress on daily workbooks.    Participation Level:  Active  Participation Quality:  Appropriate and Attentive  Affect:  Appropriate and Excited  Cognitive:  Alert and Appropriate  Insight: Appropriate  Engagement in Group:  Engaged and Supportive  Modes of Intervention:  Discussion  Additional Comments:   Pt very supportive to peers on the unit. Pt uses comedy to help with making the unit a lighter place. Peers expressed they enjoyed the laughter she brought today.   Maglione,Derric Dealmeida E 05/16/2023, 9:50 PM

## 2023-05-16 NOTE — Group Note (Signed)
Recreation Therapy Group Note   Group Topic:General Recreation  Group Date: 05/16/2023 Start Time: 1530 End Time: 1645 Facilitators: Rosina Lowenstein, LRT, CTRS Location:  Dayroom  Group Description: Recreation. Patients were given the opportunity to play cards, journal, or listen to music during group. Pt identified and conversated about things they enjoy doing in their free time and how they can continue to do that outside of the hospital.  Goal Area(s) Addressed: Patient will practice making a positive decision. Patient will have the opportunity to try a new leisure activity. Patient will communicate with peers and LRT.   Affect/Mood: Full range   Participation Level: Moderate   Participation Quality: Independent   Behavior: Alert   Speech/Thought Process: Coherent   Insight: Limited   Judgement: Limited   Modes of Intervention: Activity   Patient Response to Interventions:  Receptive   Education Outcome:  Acknowledges education   Clinical Observations/Individualized Feedback: Sebrena was somewhat active in their participation of session activities and group discussion. Pt chose to talk with peers while in group. Pt was overheard talking about her children and "smoking a bong". Pt was elevated at times with her speech.    Plan: Continue to engage patient in RT group sessions 2-3x/week.   Rosina Lowenstein, LRT, CTRS 05/16/2023 5:24 PM

## 2023-05-16 NOTE — Progress Notes (Signed)
   05/15/23 1251  Psych Admission Type (Psych Patients Only)  Admission Status Involuntary  Psychosocial Assessment  Patient Complaints Anxiety  Eye Contact Brief  Facial Expression Anxious  Affect Appropriate to circumstance  Speech Logical/coherent  Interaction Assertive  Motor Activity Restless;Fidgety  Appearance/Hygiene Unremarkable  Behavior Characteristics Cooperative  Mood Anxious  Aggressive Behavior  Effect No apparent injury  Thought Process  Coherency WDL  Content WDL  Delusions None reported or observed  Perception WDL  Hallucination None reported or observed  Judgment WDL  Confusion WDL  Danger to Self  Current suicidal ideation? Denies  Agreement Not to Harm Self Yes  Description of Agreement Verbal contract  Danger to Others  Danger to Others None reported or observed   Patient alert and oriented x 4, no distress noted, interacting appropriately with peers staff, denies SI/HI/AVH, 15 minutes safety checks maintained .

## 2023-05-16 NOTE — Plan of Care (Signed)
   Problem: Coping: Goal: Ability to verbalize frustrations and anger appropriately will improve Outcome: Progressing   Problem: Safety: Goal: Periods of time without injury will increase Outcome: Progressing   Problem: Activity: Goal: Interest or engagement in activities will improve Outcome: Progressing

## 2023-05-17 ENCOUNTER — Other Ambulatory Visit: Payer: Self-pay

## 2023-05-17 DIAGNOSIS — F332 Major depressive disorder, recurrent severe without psychotic features: Principal | ICD-10-CM | POA: Diagnosis present

## 2023-05-17 NOTE — Group Note (Signed)
Date:  05/17/2023 Time:  3:35 PM  Group Topic/Focus:  Activity Group:  The focus of the group is to promote activity for the patients to encourage them to go outside to the courtyard for some fresh air and some exercise.    Participation Level:  Active  Participation Quality:  Appropriate  Affect:  Appropriate  Cognitive:  Appropriate  Insight: Appropriate  Engagement in Group:  Engaged  Modes of Intervention:  Activity  Additional Comments:    Madeline [name] Madeline [name] 05/17/2023, 3:35 PM

## 2023-05-17 NOTE — Plan of Care (Signed)
  Problem: Education: Goal: Emotional status will improve Outcome: Progressing Goal: Mental status will improve Outcome: Progressing   Problem: Coping: Goal: Ability to verbalize frustrations and anger appropriately will improve Outcome: Progressing   

## 2023-05-17 NOTE — Group Note (Signed)
Recreation Therapy Group Note   Group Topic:Other  Group Date: 05/17/2023 Start Time: 1530 End Time: 1630 Facilitators: Clinton Gallant, CTRS Location:  Craft Room  LRT, nurses, social workers, Facilities manager, and patients got together for [employer]. LRT provided Christmas theme coloring pages, as well. Everyone voluntarily took turns singing and talking about their holiday traditions.    Affect/Mood: Appropriate   Participation Level: Active and Engaged   Modes of Intervention: Activity    Clinical Observations/Individualized Feedback: Adreena was present in the craft room for group. Pt spent her time singing karaoke and coloring. Pt interacted well with LRT and peers duration of session.   Plan: Continue to engage patient in RT group sessions 2-3x/week.   Rosina Lowenstein, LRT, CTRS 05/17/2023 5:48 PM

## 2023-05-17 NOTE — Progress Notes (Signed)
   05/17/23 1210  Psych Admission Type (Psych Patients Only)  Admission Status Involuntary  Psychosocial Assessment  Patient Complaints Anxiety  Eye Contact Brief  Facial Expression Flat  Affect Flat  Speech Logical/coherent  Interaction Assertive  Motor Activity Pacing  Appearance/Hygiene In scrubs  Behavior Characteristics Cooperative;Appropriate to situation  Mood Anxious  Thought Process  Coherency WDL  Content WDL  Delusions None reported or observed  Perception WDL  Hallucination None reported or observed  Judgment WDL  Confusion WDL  Danger to Self  Current suicidal ideation? Denies  Agreement Not to Harm Self Yes  Description of Agreement Verbal  Danger to Others  Danger to Others None reported or observed   Patient discharged at this time with all discharge instructions given with acknowledgement. Picked up by friend. Discharge medications given to patient with teaching and acknowledgement.

## 2023-05-17 NOTE — Group Note (Signed)
Recreation Therapy Group Note   Group Topic:Goal Setting  Group Date: 05/17/2023 Start Time: 1000 End Time: 1100 Facilitators: Rosina Lowenstein, LRT, CTRS Location:  Craft Room  Group Description: Product/process development scientist. Patients were given many different magazines, a glue stick, markers, and a piece of cardstock paper. LRT and pts discussed the importance of having goals in life. LRT and pts discussed the difference between short-term and long-term goals, as well as what a SMART goal is. LRT encouraged pts to create a vision board, with images they picked and then cut out with safety scissors from the magazine, for themselves, that capture their short and long-term goals. LRT encouraged pts to show and explain their vision board to the group.   Goal Area(s) Addressed:  Patient will gain knowledge of short vs. long term goals.  Patient will identify goals for themselves. Patient will practice setting SMART goals. Patient will verbalize their goals to LRT and peers.   Affect/Mood: Full range   Participation Level: Active and Engaged   Participation Quality: Minimal Cues   Behavior: Alert and Cooperative   Speech/Thought Process: Loose association   Insight: Fair   Judgement: Fair    Modes of Intervention: Art, Education, Exploration, Guided Discussion, and Support   Patient Response to Interventions:  Receptive   Education Outcome:  Acknowledges education   Clinical Observations/Individualized Feedback: Tyese was mostly active in their participation of session activities and group discussion. Pt identified "[place] meets west theme. I want to buy a house that has collaboration areas".  Pt required minimal cues to be quiet and listen to others while they were speaking. Pt talks loudly and gets worked up easily. Overall, pt interacted well with LRT and peers duration of session.   Plan: Continue to engage patient in RT group sessions 2-3x/week.   Rosina Lowenstein, LRT, CTRS 05/17/2023  1:05 PM

## 2023-05-17 NOTE — Discharge Summary (Signed)
Physician Discharge Summary Note  Patient:  Madeline [name] is an 47 y.o., female MRN:  [id number] DOB:  01-15-1976 Patient phone:  [phone] (home)  Patient address:   [address] [place] [address],  Total Time spent with patient: 45 minutes  Date of Admission:  05/13/2023 Date of Discharge: 05/17/2023  Reason for Admission:  cannabis induced psychosis  Principal Problem: Major depressive disorder, recurrent, severe without psychotic features [facility]) Discharge Diagnoses: Principal Problem:   Major depressive disorder, recurrent, severe without psychotic features (HCC) Active Problems:   Anxiety state   Marijuana smoker, continuous   Past Psychiatric History: PTSD  Past Medical History:  Past Medical History:  Diagnosis Date   Fibromyalgia    PTSD (post-traumatic stress disorder)    Pyelonephritis     Past Surgical History:  Procedure Laterality Date   CHOLECYSTECTOMY     LEFT HEART CATH AND CORONARY ANGIOGRAPHY Left 04/13/2023   Procedure: LEFT HEART CATH AND CORONARY ANGIOGRAPHY;  Surgeon: Alwyn Pea, MD;  Location: [facility] INVASIVE CV LAB;  Service: Cardiovascular;  Laterality: Left;   TUBAL LIGATION     Family History: History reviewed. No pertinent family history. Family Psychiatric  History: none Social History:  Social History   Substance and Sexual Activity  Alcohol Use Yes   Comment: occasionally     Social History   Substance and Sexual Activity  Drug Use Yes   Types: Marijuana    Social History   Socioeconomic History   Marital status: Legally Separated    Spouse name: Not on file   Number of children: Not on file   Years of education: Not on file   Highest education level: Not on file  Occupational History   Not on file  Tobacco Use   Smoking status: Every Day    Current packs/day: 0.50    Types: Cigarettes   Smokeless tobacco: Never  Vaping Use   Vaping status: Every Day  Substance and Sexual Activity    Alcohol use: Yes    Comment: occasionally   Drug use: Yes    Types: Marijuana   Sexual activity: Not Currently  Other Topics Concern   Not on file  Social History Narrative   Not on file   Social Drivers of Health   Financial Resource Strain: Not on File (06/04/2021)   Received from [employer], [employer] [place]    Financial Resource Strain: 0  Food Insecurity: Patient Declined (05/13/2023)   Hunger Vital Sign    Worried About Running Out of Food in the Last Year: Patient declined    Ran Out of Food in the Last Year: Patient declined  Transportation Needs: No Transportation Needs (05/13/2023)   PRAPARE - Administrator, Civil Service (Medical): No    Lack of Transportation (Non-Medical): No  Physical Activity: Not on File (06/04/2021)   Received from [place], [place]   Physical Activity    Physical Activity: 0  Stress: Not on File (06/04/2021)   Received from [facility], [place]   Stress    Stress: 0  Social Connections: Not on File (02/12/2023)   Received from [employer]   Social Connections    Connectedness: 0    Hospital Course:  47 yo female admitted with delusions after using cannabis.  Medications started along with therapy.  Madeline [name] stabilized with no depression today, low level of anxiety.  Denies suicidal/homicidal ideations, hallucinations, and substance abuse. Madeline [name] has met maximum capacity of hospitalizations. She will  go to live with her boyfriend.  Discharge instructions provided with explanations along with crisis numbers, follow up appointment information, and Rx.    Musculoskeletal: Strength & Muscle Tone: within normal limits Gait & Station: normal Patient leans: N/A  Psychiatric Specialty Exam: Physical Exam Vitals and nursing note reviewed.  Constitutional:      Appearance: Normal appearance.  HENT:     Head: Normocephalic.     Nose: Nose normal.  Pulmonary:     Effort: Pulmonary effort is normal.  Musculoskeletal:        General: Normal  range of motion.     Cervical back: Normal range of motion.  Neurological:     General: No focal deficit present.     Mental Status: She is alert and oriented to person, place, and time.     Review of Systems  Psychiatric/Behavioral:  The patient is nervous/anxious.   All other systems reviewed and are negative.   Blood pressure 128/84, pulse (!) 107, temperature 98.7 F (37.1 C), temperature source Oral, resp. rate 18, height 5' 3.25" (1.607 m), weight 72.1 kg, SpO2 97%.Body mass index is 27.94 kg/m.  General Appearance: Casual  Eye Contact:  Good  Speech:  Normal Rate  Volume:  Normal  Mood:  Anxious  Affect:  Congruent  Thought Process:  Coherent  Orientation:  Full (Time, Place, and Person)  Thought Content:  Logical  Suicidal Thoughts:  No  Homicidal Thoughts:  No  Memory:  Immediate;   Good Recent;   Good Remote;   Good  Judgement:  Fair  Insight:  Fair  Psychomotor Activity:  Normal  Concentration:  Concentration: Good and Attention Span: Good  Recall:  Good  Fund of Knowledge:  Fair  Language:  Good  Akathisia:  No  Handed:  Right  AIMS (if indicated):     Assets:  Housing Leisure Time Physical Health Resilience Social Support  ADL's:  Intact  Cognition:  WNL  Sleep:         Physical Exam: Physical Exam Vitals and nursing note reviewed.  Constitutional:      Appearance: Normal appearance.  HENT:     Head: Normocephalic.     Nose: Nose normal.  Pulmonary:     Effort: Pulmonary effort is normal.  Musculoskeletal:        General: Normal range of motion.     Cervical back: Normal range of motion.  Neurological:     General: No focal deficit present.     Mental Status: She is alert and oriented to person, place, and time.    Review of Systems  Psychiatric/Behavioral:  The patient is nervous/anxious.   All other systems reviewed and are negative.  Blood pressure 128/84, pulse (!) 107, temperature 98.7 F (37.1 C), temperature source Oral,  resp. rate 18, height 5' 3.25" (1.607 m), weight 72.1 kg, SpO2 97%. Body mass index is 27.94 kg/m.   Social History   Tobacco Use  Smoking Status Every Day   Current packs/day: 0.50   Types: Cigarettes  Smokeless Tobacco Never   Tobacco Cessation:  A prescription for an FDA-approved tobacco cessation medication was offered at discharge and the patient refused   Blood Alcohol level:  Lab Results  Component Value Date   ETH <10 05/12/2023    Metabolic Disorder Labs:  No results found for: "HGBA1C", "MPG" No results found for: "PROLACTIN" Lab Results  Component Value Date   CHOL 202 (H) 05/14/2023   TRIG 87 05/14/2023   HDL  53 05/14/2023   CHOLHDL 3.8 05/14/2023   VLDL 17 05/14/2023   LDLCALC 132 (H) 05/14/2023    See Psychiatric Specialty Exam and Suicide Risk Assessment completed by Attending Physician prior to discharge.  Discharge destination:  Home  Is patient on multiple antipsychotic therapies at discharge:  No   Has Patient had three or more failed trials of antipsychotic monotherapy by history:  No  Recommended Plan for Multiple Antipsychotic Therapies: NA  Discharge Instructions     Diet - low sodium heart healthy   Complete by: As directed    Discharge instructions   Complete by: As directed    Follow up with outpatient appointment   Increase activity slowly   Complete by: As directed       Allergies as of 05/17/2023       Reactions   Ciprofloxacin Swelling   Codeine Other (See Comments)   GI upset   Hydrocodone Other (See Comments)   GI upset        Medication List     STOP taking these medications    venlafaxine 75 MG tablet Commonly known as: EFFEXOR Replaced by: venlafaxine XR 75 MG 24 hr capsule       TAKE these medications      Indication  aspirin EC 81 MG tablet Take 1 tablet (81 mg total) by mouth daily. Swallow whole.  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  Indication: Anxiety Disorder   clopidogrel 75 MG tablet Commonly known as: PLAVIX Take 1 tablet (75 mg total) by mouth daily.  Indication: Carotid Artery Stenting   hydrOXYzine 25 MG tablet Commonly known as: ATARAX Take 1 tablet (25 mg total) by mouth 3 (three) times daily as needed for anxiety.  Indication: Feeling Tense   ibuprofen 600 MG tablet Commonly known as: ADVIL Take 1 tablet (600 mg total) by mouth every 8 (eight) hours as needed for moderate pain (pain score 4-6).  Indication: Joint Damage causing Pain and Loss of Function   isosorbide mononitrate 30 MG 24 hr tablet Commonly known as: IMDUR Take 1 tablet (30 mg total) by mouth daily.  Indication: Stable Angina Pectoris   melatonin 5 MG Tabs Take 0.5 tablets (2.5 mg total) by mouth at bedtime as needed.  Indication: Trouble Sleeping   metoprolol succinate 25 MG 24 hr tablet Commonly known as: TOPROL-XL Take 0.5 tablets (12.5 mg total) by mouth daily.  Indication: High Blood Pressure   nicotine 14 mg/24hr patch Commonly known as: NICODERM CQ - dosed in mg/24 hours Place 1 patch (14 mg total) onto the skin daily.  Indication: Nicotine Addiction   OVER THE COUNTER MEDICATION Take 2 tablets by mouth daily. Olly stress gummy (Blue)  Indication: supplement   OVER THE COUNTER MEDICATION Take 1 tablet by mouth daily at 12 noon. Olly Daily Energy [place] Si)  Indication: supplement   rosuvastatin 40 MG tablet Commonly known as: CRESTOR Take 40 mg by mouth daily.  Indication: High Amount of Fats in the Blood   topiramate 50 MG tablet Commonly known as: TOPAMAX Take 1 tablet (50 mg total) by mouth daily.  Indication: Migraine Headache   venlafaxine XR 75 MG 24 hr capsule Commonly known as: EFFEXOR-XR Take 1 capsule (75 mg total) by mouth daily with breakfast. Replaces: venlafaxine 75 MG tablet  Indication: Migraine Headache, Social Anxiety Disorder  Follow-up Information     [facility] Follow up.   Why: Telephone appointment on 05/17/33 at 10:30 AM via the phone and will receive a call at [phone]. Contact information: [address]  [address] [place] [address] [phone]                 Follow-up recommendations:  Activity:  as tolerated Diet:  heart healthy diet Major depressive disorder, recurrent, severe without psychosis: Effexor 75 mg daily  Anxiety: Buspar 10 mg BID Hydroxyzine 25 mg TID PRN  Insomnia: Trazodone 50 mg daily at bedtime PRN  Comments:  follow up with appointment above  Signed: Nanine Means, NP 05/17/2023, 4:19 PM

## 2023-05-17 NOTE — Group Note (Signed)
LCSW Group Therapy Note   Group Date: 05/17/2023 Start Time: 1300 End Time: 1405   Type of Therapy and Topic:  Group Therapy: Challenging Core Beliefs  Participation Level:  Active  Description of Group:  Patients were educated about core beliefs and asked to identify one harmful core belief that they have. Patients were asked to explore from where those beliefs originate. Patients were asked to discuss how those beliefs make them feel and the resulting behaviors of those beliefs. They were then be asked if those beliefs are true and, if so, what evidence they have to support them. Lastly, group members were challenged to replace those negative core beliefs with helpful beliefs.   Therapeutic Goals:   1. Patient will identify harmful core beliefs and explore the origins of such beliefs. 2. Patient will identify feelings and behaviors that result from those core beliefs. 3. Patient will discuss whether such beliefs are true. 4.  Patient will replace harmful core beliefs with helpful ones.  Summary of Patient Progress:  Patient actively engaged in processing and exploring how core beliefs are formed and how they impact thoughts, feelings, and behaviors. Patient proved open to input from peers and feedback from CSW. Patient demonstrated proficient  insight into the subject matter, was respectful and supportive of peers, and participated throughout the entire session.  Therapeutic Modalities: Cognitive Behavioral Therapy; Solution-Focused Therapy   Lowry Ram, LCSWA 05/17/2023  2:42 PM

## 2023-05-17 NOTE — [facility] Suicide Risk Assessment (Signed)
[facility] Discharge Suicide Risk Assessment   Principal Problem: Major depressive disorder, recurrent, severe without psychotic features (HCC) Discharge Diagnoses: Principal Problem:   Major depressive disorder, recurrent, severe without psychotic features (HCC) Active Problems:   Anxiety state   Marijuana smoker, continuous   Total Time spent with patient: 45 minutes  Musculoskeletal: Strength & Muscle Tone: within normal limits Gait & Station: normal Patient leans: N/A   Psychiatric Specialty Exam: Physical Exam Vitals and nursing note reviewed.  Constitutional:      Appearance: Normal appearance.  HENT:     Head: Normocephalic.     Nose: Nose normal.  Pulmonary:     Effort: Pulmonary effort is normal.  Musculoskeletal:        General: Normal range of motion.     Cervical back: Normal range of motion.  Neurological:     General: No focal deficit present.     Mental Status: She is alert and oriented to person, place, and time.       Review of Systems  Psychiatric/Behavioral:  The patient is nervous/anxious.   All other systems reviewed and are negative.    Blood pressure 128/84, pulse (!) 107, temperature 98.7 F (37.1 C), temperature source Oral, resp. rate 18, height 5' 3.25" (1.607 m), weight 72.1 kg, SpO2 97%.Body mass index is 27.94 kg/m.  General Appearance: Casual  Eye Contact:  Good  Speech:  Normal Rate  Volume:  Normal  Mood:  Anxious  Affect:  Congruent  Thought Process:  Coherent  Orientation:  Full (Time, Place, and Person)  Thought Content:  Logical  Suicidal Thoughts:  No  Homicidal Thoughts:  No  Memory:  Immediate;   Good Recent;   Good Remote;   Good  Judgement:  Fair  Insight:  Fair  Psychomotor Activity:  Normal  Concentration:  Concentration: Good and Attention Span: Good  Recall:  Good  Fund of Knowledge:  Fair  Language:  Good  Akathisia:  No  Handed:  Right  AIMS (if indicated):     Assets:  Housing Leisure Time Physical  Health Resilience Social Support  ADL's:  Intact  Cognition:  WNL  Sleep:            Physical Exam: Physical Exam Vitals and nursing note reviewed.  Constitutional:      Appearance: Normal appearance.  HENT:     Head: Normocephalic.     Nose: Nose normal.  Pulmonary:     Effort: Pulmonary effort is normal.  Musculoskeletal:        General: Normal range of motion.     Cervical back: Normal range of motion.  Neurological:     General: No focal deficit present.     Mental Status: She is alert and oriented to person, place, and time.    Review of Systems  Psychiatric/Behavioral:  The patient is nervous/anxious.   All other systems reviewed and are negative.  Blood pressure 128/84, pulse (!) 107, temperature 98.7 F (37.1 C), temperature source Oral, resp. rate 18, height 5' 3.25" (1.607 m), weight 72.1 kg, SpO2 97%. Body mass index is 27.94 kg/m.  Mental Status Per Nursing Assessment::   On Admission:  NA  Demographic Factors:  NA  Loss Factors: NA  Historical Factors: NA  Risk Reduction Factors:   Sense of responsibility to family and Positive social support  Continued Clinical Symptoms:  Anxiety, mild  Cognitive Features That Contribute To Risk:  None    Suicide Risk:  Minimal: No identifiable suicidal ideation.  Patients presenting with no risk factors but with morbid ruminations; may be classified as minimal risk based on the severity of the depressive symptoms   Follow-up Information     [facility] Follow up.   Why: Telephone appointment on 05/17/33 at 10:30 AM via the phone and will receive a call at [phone]. Contact information: [address]  [address] [place] [address] [phone]                 Plan Of Care/Follow-up recommendations:  Activity:  as tolerated Diet:  heart healthy diet Major depressive disorder, recurrent, severe without psychosis: Effexor 75 mg daily  Anxiety: Buspar 10 mg BID Hydroxyzine 25 mg  TID PRN  Insomnia: Trazodone 50 mg daily at bedtime PRN  Madeline Means, NP 05/17/2023, 4:26 PM

## 2023-05-17 NOTE — Progress Notes (Signed)
Patient voiced no concerns or complaints. Denies SI, HI, aVH. Request prn given for sleep was effective, but awakened by peer yelling throughout night. Pt reports improvement. Has not mentioned her stepmother this shift. Encouragement and support provided. Safety checks maintained. Medications given as prescribed. Pt receptive and remains safe on unit with q 15 mins.

## 2023-05-17 NOTE — Progress Notes (Signed)
  [facility] Adult Case Management Discharge Plan :  Will you be returning to the same living situation after discharge:  No. Patient to live with her former partner.  At discharge, do you have transportation home?: Yes,  Partner to provide transportation.  Do you have the ability to pay for your medications:  Yes, [employer] MEDICARE / [employer] MEDICARE HMO   Release of information consent forms completed and in the chart;  Patient's signature needed at discharge.  Patient to Follow up at:  Follow-up Information     [facility] Follow up.   Why: Telephone appointment on 05/17/33 at 10:30 AM via the phone and will receive a call at [phone]. Contact information: [address]  [address] [place] [address] [phone]                 Next level of care provider has access to [facility] Link:no  Safety Planning and Suicide Prevention discussed: Yes,  SPE completed with patient's previous partner. SPE material provided at discharge.     Has patient been referred to the Quitline?: Yes, faxed/e-referral on 05/17/2023  Patient has been referred for addiction treatment: No known substance use disorder.  Lowry Ram, LCSW 05/17/2023, 11:14 AM

## 2023-05-19 ENCOUNTER — Other Ambulatory Visit: Payer: Self-pay

## 2024-03-01 ENCOUNTER — Other Ambulatory Visit: Payer: Self-pay

## 2024-03-01 ENCOUNTER — Emergency Department

## 2024-03-01 ENCOUNTER — Emergency Department
Admission: EM | Admit: 2024-03-01 | Discharge: 2024-03-01 | Disposition: A | Discharge status: Home / Self Care | Attending: Emergency Medicine | Admitting: Emergency Medicine

## 2024-03-01 DIAGNOSIS — R531 Weakness: Secondary | ICD-10-CM | POA: Insufficient documentation

## 2024-03-01 DIAGNOSIS — I2510 Atherosclerotic heart disease of native coronary artery without angina pectoris: Secondary | ICD-10-CM | POA: Insufficient documentation

## 2024-03-01 DIAGNOSIS — M797 Fibromyalgia: Secondary | ICD-10-CM | POA: Diagnosis not present

## 2024-03-01 DIAGNOSIS — G43109 Migraine with aura, not intractable, without status migrainosus: Secondary | ICD-10-CM

## 2024-03-01 DIAGNOSIS — R9431 Abnormal electrocardiogram [ECG] [EKG]: Secondary | ICD-10-CM | POA: Diagnosis not present

## 2024-03-01 DIAGNOSIS — R0789 Other chest pain: Secondary | ICD-10-CM | POA: Diagnosis not present

## 2024-03-01 DIAGNOSIS — G43909 Migraine, unspecified, not intractable, without status migrainosus: Secondary | ICD-10-CM | POA: Insufficient documentation

## 2024-03-01 DIAGNOSIS — R29818 Other symptoms and signs involving the nervous system: Secondary | ICD-10-CM | POA: Diagnosis not present

## 2024-03-01 DIAGNOSIS — F17210 Nicotine dependence, cigarettes, uncomplicated: Secondary | ICD-10-CM | POA: Insufficient documentation

## 2024-03-01 DIAGNOSIS — F4310 Post-traumatic stress disorder, unspecified: Secondary | ICD-10-CM | POA: Diagnosis not present

## 2024-03-01 DIAGNOSIS — R519 Headache, unspecified: Secondary | ICD-10-CM

## 2024-03-01 DIAGNOSIS — R4701 Aphasia: Secondary | ICD-10-CM

## 2024-03-01 LAB — COMPREHENSIVE METABOLIC PANEL WITH GFR
ALT: 15 U/L (ref 0–44)
AST: 22 U/L (ref 15–41)
Albumin: 3.8 g/dL (ref 3.5–5.0)
Alkaline Phosphatase: 49 U/L (ref 38–126)
Anion gap: 8 (ref 5–15)
BUN: 11 mg/dL (ref 6–20)
CO2: 19 mmol/L — ABNORMAL LOW (ref 22–32)
Calcium: 8.9 mg/dL (ref 8.9–10.3)
Chloride: 110 mmol/L (ref 98–111)
Creatinine, Ser: 0.78 mg/dL (ref 0.44–1.00)
GFR, Estimated: >60 mL/min (ref >60)
Glucose, Bld: 126 mg/dL — ABNORMAL HIGH (ref 70–99)
Potassium: 3.2 mmol/L — ABNORMAL LOW (ref 3.5–5.1)
Sodium: 137 mmol/L (ref 135–145)
Total Bilirubin: 0.6 mg/dL (ref 0.0–1.2)
Total Protein: 7.2 g/dL (ref 6.5–8.1)

## 2024-03-01 LAB — TROPONIN I (HIGH SENSITIVITY)
Troponin I (High Sensitivity): 9 ng/L (ref <18)
Troponin I (High Sensitivity): 13 ng/L (ref <18)

## 2024-03-01 LAB — PROTIME-INR
INR: 1 (ref 0.8–1.2)
Prothrombin Time: 13.3 s (ref 11.4–15.2)

## 2024-03-01 LAB — DIFFERENTIAL
Abs Immature Granulocytes: 0.03 K/uL (ref 0.00–0.07)
Basophils Absolute: 0 K/uL (ref 0.0–0.1)
Basophils Relative: 0 %
Eosinophils Absolute: 0.1 K/uL (ref 0.0–0.5)
Eosinophils Relative: 1 %
Immature Granulocytes: 0 %
Lymphocytes Relative: 26 %
Lymphs Abs: 2.7 K/uL (ref 0.7–4.0)
Monocytes Absolute: 0.6 K/uL (ref 0.1–1.0)
Monocytes Relative: 6 %
Neutro Abs: 6.7 K/uL (ref 1.7–7.7)
Neutrophils Relative %: 67 %

## 2024-03-01 LAB — CBG MONITORING, ED: Glucose-Capillary: 120 mg/dL — ABNORMAL HIGH (ref 70–99)

## 2024-03-01 LAB — CBC
HCT: 40.8 % (ref 36.0–46.0)
Hemoglobin: 13.6 g/dL (ref 12.0–15.0)
MCH: 30.6 pg (ref 26.0–34.0)
MCHC: 33.3 g/dL (ref 30.0–36.0)
MCV: 91.7 fL (ref 80.0–100.0)
Platelets: 271 K/uL (ref 150–400)
RBC: 4.45 MIL/uL (ref 3.87–5.11)
RDW: 12.3 % (ref 11.5–15.5)
WBC: 10.1 K/uL (ref 4.0–10.5)
nRBC: 0 % (ref 0.0–0.2)

## 2024-03-01 LAB — ETHANOL: Alcohol, Ethyl (B): <15 mg/dL (ref <15)

## 2024-03-01 LAB — APTT: aPTT: 30 s (ref 24–36)

## 2024-03-01 MED ORDER — BUSPIRONE HCL 10 MG PO TABS: 10.0000 mg | ORAL_TABLET | Freq: Two times a day (BID) | ORAL | 0 refills | Status: AC

## 2024-03-01 MED ORDER — PROCHLORPERAZINE EDISYLATE 10 MG/2ML IJ SOLN
10.0000 mg | Freq: Once | INTRAMUSCULAR | Status: AC
  Administered 2024-03-01: 10 mg via INTRAVENOUS
  Filled 2024-03-01: qty 2

## 2024-03-01 MED ORDER — SODIUM CHLORIDE 0.9% FLUSH
3.0000 mL | Freq: Once | INTRAVENOUS | Status: AC
  Administered 2024-03-01: 3 mL via INTRAVENOUS

## 2024-03-01 MED ORDER — IOHEXOL 350 MG/ML SOLN
75.0000 mL | Freq: Once | INTRAVENOUS | Status: AC | PRN
  Administered 2024-03-01: 75 mL via INTRAVENOUS

## 2024-03-01 MED ORDER — DIPHENHYDRAMINE HCL 50 MG/ML IJ SOLN
25.0000 mg | Freq: Once | INTRAMUSCULAR | Status: AC
  Administered 2024-03-01: 25 mg via INTRAVENOUS
  Filled 2024-03-01: qty 1

## 2024-03-01 MED ORDER — TOPIRAMATE 50 MG PO TABS: 50.0000 mg | ORAL_TABLET | Freq: Every day | ORAL | 0 refills | Status: AC

## 2024-03-01 MED ORDER — KETOROLAC TROMETHAMINE 30 MG/ML IJ SOLN
15.0000 mg | Freq: Once | INTRAMUSCULAR | Status: AC
  Administered 2024-03-01: 15 mg via INTRAVENOUS
  Filled 2024-03-01: qty 1

## 2024-03-01 MED ORDER — METOPROLOL SUCCINATE ER 25 MG PO TB24: 12.5000 mg | ORAL_TABLET | Freq: Every day | ORAL | 0 refills | Status: DC

## 2024-03-01 MED ORDER — ROSUVASTATIN CALCIUM 40 MG PO TABS: 40.0000 mg | ORAL_TABLET | Freq: Every day | ORAL | 0 refills | Status: DC

## 2024-03-01 MED ORDER — BUTALBITAL-APAP-CAFFEINE 50-325-40 MG PO TABS
2.0000 | ORAL_TABLET | Freq: Once | ORAL | Status: AC
  Administered 2024-03-01: 2 via ORAL
  Filled 2024-03-01: qty 2

## 2024-03-01 MED ORDER — ISOSORBIDE MONONITRATE ER 30 MG PO TB24: 30.0000 mg | ORAL_TABLET | Freq: Every day | ORAL | 0 refills | Status: DC

## 2024-03-01 MED ORDER — VENLAFAXINE HCL ER 75 MG PO CP24: 75.0000 mg | ORAL_CAPSULE | Freq: Every day | ORAL | 0 refills | Status: AC

## 2024-03-01 NOTE — Progress Notes (Signed)
 [facility]OKE- PHARMACY COMMUNICATION   Time CODE STROKE called/[place] received: 11:35  Time response to CODE STROKE was made (in person or via phone): in-person  Name of Provider/Nurse contacted: Dr. Voncile Kleine  Past Medical History:  Diagnosis Date   Fibromyalgia    PTSD (post-traumatic stress disorder)    Pyelonephritis    Prior to Admission medications   Medication Sig Start Date End Date Taking? Authorizing Provider  busPIRone  (BUSPAR ) 10 MG tablet Take 10 mg by mouth 2 (two) times daily. 09/13/16   [provider]  isosorbide  mononitrate (IMDUR ) 30 MG 24 hr tablet Take 1 tablet (30 mg total) by mouth daily. 05/17/23 06/16/23  Nicholaus Brad RAMAN, NP  metoprolol  succinate (TOPROL -XL) 25 MG 24 hr tablet Take 0.5 tablets (12.5 mg total) by mouth daily. 05/17/23 06/16/23  Nicholaus Brad RAMAN, NP  OVER THE COUNTER MEDICATION Take 2 tablets by mouth daily. Olly stress gummy (Blue)    [provider]  OVER THE COUNTER MEDICATION Take 1 tablet by mouth daily at 12 noon. Olly Daily Energy Dori)    [provider]  rosuvastatin  (CRESTOR ) 40 MG tablet Take 40 mg by mouth daily. 03/30/23 03/29/24  [provider]  topiramate  (TOPAMAX ) 50 MG tablet Take 1 tablet (50 mg total) by mouth daily. 05/17/23 06/16/23  Nicholaus Brad RAMAN, NP  venlafaxine  XR (EFFEXOR -XR) 75 MG 24 hr capsule Take 1 capsule (75 mg total) by mouth daily with breakfast. 05/17/23 06/16/23  Nicholaus Brad RAMAN, NP    Madeline [name] ,PharmD Clinical Pharmacist  03/01/2024  11:54 AM

## 2024-03-01 NOTE — ED Provider Notes (Signed)
 [facility] Provider Note    Event Date/Time   First MD Initiated Contact with Patient 03/01/24 1149     (approximate)   History   Code Stroke   HPI  Madeline [name] is a 48 y.o. female who presents to the ED for evaluation of Code Stroke   I review a [facility] cardiology clinic visit from 04/25/2023.  Outpatient cath with multivessel CAD and they recommended CABG, patient referred to [facility] at her request.  Patient presents to the ED from the field via EMS as a code stroke.  Patient reports a week of severe migrainous headache which is why she called 911.  EMS noted mild left-sided weakness and concerns for confusion so they activated code stroke protocols.  Also reports intermittent chest discomfort for a long time.   Physical Exam   Triage Vital Signs: ED Triage Vitals  Encounter Vitals Group     BP      Girls Systolic BP Percentile      Girls Diastolic BP Percentile      Boys Systolic BP Percentile      Boys Diastolic BP Percentile      Pulse      Resp      Temp      Temp src      SpO2      Weight      Height      Head Circumference      Peak Flow      Pain Score      Pain Loc      Pain Education      Exclude from Growth Chart     Most recent vital signs: Vitals:   03/01/24 1330 03/01/24 1400  BP: 106/62 100/63  Pulse: (!) 59 (!) 51  Resp: 16 (!) 21  SpO2: 100% 99%    General: Awake, no distress.  CV:  Good peripheral perfusion.  Resp:  Normal effort.  Abd:  No distention.  MSK:  No deformity noted.  Neuro:  No focal deficits appreciated. Other:     ED Results / Procedures / Treatments   Labs (all labs ordered are listed, but only abnormal results are displayed) Labs Reviewed  COMPREHENSIVE METABOLIC PANEL WITH GFR - Abnormal; Notable for the following components:      Result Value   Potassium 3.2 (*)    CO2 19 (*)    Glucose, Bld 126 (*)    All other components within normal limits  CBG MONITORING, ED -  Abnormal; Notable for the following components:   Glucose-Capillary 120 (*)    All other components within normal limits  PROTIME-INR  APTT  CBC  DIFFERENTIAL  ETHANOL  POC URINE PREG, ED  TROPONIN I (HIGH SENSITIVITY)  TROPONIN I (HIGH SENSITIVITY)    EKG Sinus rhythm with a rate of 67 bpm.  Normal axis and intervals.  Mild upsloping ST elevations to inferior leads without STEMI. Similar morphology as comparison from last year  RADIOLOGY CT head interpreted by me without evidence of acute intracranial pathology  Official radiology report(s): CT ANGIO HEAD NECK W WO CM (CODE STROKE) Result Date: 03/01/2024 EXAM: CTA Head and Neck with Intravenous Contrast. CT Head without Contrast. CLINICAL HISTORY: Stroke, follow up. 75ml [id number]; Pt is codes stroke. TECHNIQUE: Axial CTA images of the head and neck performed with intravenous contrast (75 mL iohexol  (OMNIPAQUE ) 350 MG/ML injection). MIP reconstructed images were created and reviewed. Axial computed tomography images of the head/brain performed  without intravenous contrast. Note: Per PQRS, the description of internal carotid artery percent stenosis, including 0 percent or normal exam, is based on [place] American Symptomatic Carotid Endarterectomy Trial (NASCET) criteria. Dose reduction technique was used including one or more of the following: automated exposure control, adjustment of mA and kV according to patient size, and/or iterative reconstruction. CONTRAST: Without and with; COMPARISON: Same day CT head. FINDINGS: CT HEAD: BRAIN: No acute intraparenchymal hemorrhage. No mass lesion. No CT evidence for acute territorial infarct. No midline shift or extra-axial collection. VENTRICLES: No hydrocephalus. ORBITS: The orbits are unremarkable. SINUSES AND MASTOIDS: The paranasal sinuses and mastoid air cells are clear. CTA NECK: COMMON CAROTID ARTERIES: No significant stenosis. No dissection or occlusion. INTERNAL CAROTID ARTERIES: No stenosis  by NASCET criteria. No dissection or occlusion. VERTEBRAL ARTERIES: The left vertebral artery is dominant. No significant stenosis. No dissection or occlusion. 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 The remaining visualized anterior cerebral artery segments are patent bilaterally. No significant stenosis. No aneurysm. MIDDLE CEREBRAL ARTERIES: The middle cerebral arteries are patent bilaterally. No significant stenosis. No occlusion. No aneurysm. POSTERIOR CEREBRAL ARTERIES: No significant stenosis. No occlusion. No aneurysm. BASILAR ARTERY: No significant stenosis. No occlusion. No aneurysm. OTHER: SOFT TISSUES: No acute finding. No masses or lymphadenopathy. BONES: Edentulous maxilla, multiple chronically absent mandibular teeth, mild degenerative changes in the visualized spine. No acute osseous abnormality. LUNGS: Incidentally noted azygous lobe. IMPRESSION: 1. No large vessel occlusion, high-grade stenosis, aneurysmal dilatation, or dissection involving the arteries of the head and neck. Electronically signed by: Donnice Mania MD 03/01/2024 12:24 PM EDT RP Workstation: [id number]   CT HEAD CODE STROKE WO CONTRAST Result Date: 03/01/2024 EXAM: CT HEAD WITHOUT CONTRAST 03/01/2024 11:55:46 AM TECHNIQUE: CT of the head was performed without the administration of intravenous contrast. Automated exposure control, iterative reconstruction, and/or weight based adjustment of the mA/kV was utilized to reduce the radiation dose to as low as reasonably achievable. COMPARISON: CT head 11/13/2012. CLINICAL HISTORY: Neuro deficit, acute, stroke suspected. Aphasia, left weakness, LKW 1105. Dr Arora [phone]. FINDINGS: BRAIN AND VENTRICLES: No acute hemorrhage. No evidence of acute infarct. No hydrocephalus. No extra-axial collection. No mass effect or midline shift. [place] stroke program early CT (ASPECT) score: Ganglionic (caudate,  internal capsule, lentiform nucleus, insula, M1-M3): 7. Supraganglionic (M4-M6): 3. Total: 10. ORBITS: No acute abnormality. SINUSES: No acute abnormality. SOFT TISSUES AND SKULL: Metallic focus in the subcutaneous tissues along the superior aspect of the right orbit compatible with metallic jewelry. No skull fracture. IMPRESSION: 1. No acute intracranial abnormality. 2. ASPECTS is 10. 3. Findings messaged to Dr. Arora at 12:08PM on 03/01/24. Electronically signed by: Donnice Mania MD 03/01/2024 12:10 PM EDT RP Workstation: [id number]    PROCEDURES and INTERVENTIONS:  .Critical Care  Performed by: Claudene Rover, MD Authorized by: Claudene Rover, MD   Critical care provider statement:    Critical care time (minutes):  30   Critical care time was exclusive of:  Separately billable procedures and treating other patients   Critical care was necessary to treat or prevent imminent or life-threatening deterioration of the following conditions:  CNS failure or compromise   Critical care was time spent personally by me on the following activities:  Development of treatment plan with patient or surrogate, discussions with consultants, evaluation of patient's response to treatment, examination of patient, ordering and review of laboratory studies, ordering and review of radiographic studies, ordering and performing  treatments and interventions, pulse oximetry, re-evaluation of patient's condition and review of old charts   Medications  sodium chloride  flush (NS) 0.9 % injection 3 mL (3 mLs Intravenous Given 03/01/24 1246)  iohexol  (OMNIPAQUE ) 350 MG/ML injection 75 mL (75 mLs Intravenous Contrast Given 03/01/24 1157)  ketorolac  (TORADOL ) 30 MG/ML injection 15 mg (15 mg Intravenous Given 03/01/24 1240)  butalbital-acetaminophen -caffeine (FIORICET) 50-325-40 MG per tablet 2 tablet (2 tablets Oral Given 03/01/24 1241)  prochlorperazine (COMPAZINE) injection 10 mg (10 mg Intravenous Given 03/01/24 1240)   diphenhydrAMINE  (BENADRYL ) injection 25 mg (25 mg Intravenous Given 03/01/24 1240)     IMPRESSION / MDM / ASSESSMENT AND PLAN / ED COURSE  I reviewed the triage vital signs and the nursing notes.  Differential diagnosis includes, but is not limited to, stroke, seizure, complex migraine, ACS  {Patient presents with symptoms of an acute illness or injury that is potentially life-threatening.  Patient presents to the ED as a field code stroke.  After CT, back in the room, for my exam she does not clearly have any neurologic deficits.  CT imaging is reassuring, MRI pending at the time of signout to oncoming physician.  Nonischemic EKG and 2 negative troponins without ongoing chest discomfort.  Normal CBC, mild hypokalemia.  Consults neurology who agrees patient to be suitable for outpatient management from their perspective if MRI is negative.  Clinical Course as of 03/01/24 1522  Thu Mar 01, 2024  1149 I consult with neurologist , Dr. Voncile [DS]  1210 Consult again with neurology who recommends providing migraine cocktail and obtaining MRI brain.  Reassuring CT/CTA [DS]  1220 Reassessed after she is brought back to a room [DS]  1333 Reassessed, more comfortable after migraine cocktail [DS]    Clinical Course User Index [DS] Claudene Rover, MD     FINAL CLINICAL IMPRESSION(S) / ED DIAGNOSES   Final diagnoses:  None     Rx / DC Orders   ED Discharge Orders     None        Note:  This document was prepared using Dragon voice recognition software and may include unintentional dictation errors.   Claudene Rover, MD 03/01/24 [phone]

## 2024-03-01 NOTE — Consult Note (Signed)
 Complaint NEUROLOGY CONSULT NOTE   Date of service: March 01, 2024 Patient Name: Madeline [name] MRN:  [id number] DOB:  02-02-1976 Chief Complaint: Code stroke-left-sided weakness  Requesting Provider: Claudene Rover, MD  History of Present Illness  Madeline [name] is a 48 y.o. 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 She was seen emergently in the emergency department. Detailed examination below Reports that she has been having trouble getting her medications for the past few months.  Not been taking her venlafaxine .  Takes her topiramate  for migraine prophylaxis which she is compliant.  Reports having had a headache for a week.  Has been asking family to get her to the hospital but has not been able to do that for the past few days to a week. Reports some photophobia and phonophobia.  LKW: 11 AM in terms of left-sided weakness but has been having a headache for a week Modified rankin score: 0-Completely asymptomatic and back to baseline post- stroke IV Thrombolysis: Functional neurological exam, too mild to treat EVT: No ELVO  NIHSS components Score: Comment  1a Level of Conscious 0[x]  1[]  2[]  3[]      1b LOC Questions 0[x]  1[]  2[]       1c LOC Commands 0[x]  1[]  2[]       2 Best Gaze 0[x]  1[]  2[]       3 Visual 0[x]  1[]  2[]  3[]      4 Facial Palsy 0[x]  1[]  2[]  3[]      5a Motor Arm - left 0[x]  1[]  2[]  3[]  4[]  UN[]    5b Motor Arm - Right 0[x]  1[]  2[]  3[]  4[]  UN[]    6a Motor Leg - Left 0[]  1[x]  2[]  3[]  4[]  UN[]    6b Motor Leg - Right 0[x]  1[]  2[]  3[]  4[]  UN[]    7 Limb Ataxia 0[x]  1[]  2[]  UN[]      8 Sensory 0[]  1[x]  2[]  UN[]      9  Best Language 0[x]  1[]  2[]  3[]      10 Dysarthria 0[x]  1[]  2[]  UN[]      11 Extinct. and Inattention 0[x]  1[]  2[]       TOTAL: 2      ROS  ROS performed and pertinent positives noted in the HPI  Past History   Past Medical History:  Diagnosis Date   Fibromyalgia    PTSD (post-traumatic stress disorder)    Pyelonephritis     Past Surgical History:  Procedure Laterality Date   CHOLECYSTECTOMY     LEFT HEART CATH AND CORONARY ANGIOGRAPHY Left 04/13/2023   Procedure: LEFT HEART CATH AND CORONARY ANGIOGRAPHY;  Surgeon: Florencio Cara BIRCH, MD;  Location: [facility] INVASIVE CV LAB;  Service: Cardiovascular;  Laterality: Left;   TUBAL LIGATION      Family History: History reviewed. No pertinent family history.  Social History  reports that she has been smoking cigarettes. She has never used smokeless tobacco. She reports current alcohol use. She reports current drug use. Drug: Marijuana.  Allergies  Allergen Reactions   Ciprofloxacin Swelling   Codeine Other (See Comments)    GI upset   Hydrocodone  Other (See Comments)    GI upset    Medications  No current facility-administered medications for this encounter.  Current Outpatient  Medications:    busPIRone  (BUSPAR ) 10 MG tablet, Take 10 mg by mouth 2 (two) times daily., Disp: , Rfl:    isosorbide  mononitrate (IMDUR ) 30 MG 24 hr tablet, Take 1 tablet (30 mg total) by mouth daily., Disp: 30 tablet, Rfl: 0   metoprolol  succinate (TOPROL -XL) 25 MG 24 hr tablet, Take 0.5 tablets (12.5 mg total) by mouth daily., Disp: 15 tablet, Rfl: 0   OVER THE COUNTER MEDICATION, Take 2 tablets by mouth daily. Olly stress gummy (Blue), Disp: , Rfl:    OVER THE COUNTER MEDICATION, Take 1 tablet by mouth daily at 12 noon. Olly Daily Energy Dori), Disp: , Rfl:    rosuvastatin  (CRESTOR ) 40 MG tablet, Take 40 mg by mouth daily., Disp: , Rfl:    topiramate  (TOPAMAX ) 50 MG tablet, Take 1 tablet (50 mg total) by mouth daily., Disp: 30 tablet, Rfl: 0    venlafaxine  XR (EFFEXOR -XR) 75 MG 24 hr capsule, Take 1 capsule (75 mg total) by mouth daily with breakfast., Disp: 30 capsule, Rfl: 0  Vitals   Vitals:   14-Mar-2024 1217  BP: 132/87  Pulse: 77  Resp: 20  SpO2: 100%  Weight: 69.5 kg  Height: 5' 3 (1.6 m)    Body mass index is 27.14 kg/m.   Physical Exam   Constitutional: Appears well-developed and well-nourished.  Psych: Affect appropriate to situation.  Eyes: No scleral injection.  HENT: No OP obstruction.  Head: Normocephalic.  Cardiovascular: Normal rate and regular rhythm.  Palpably reproducible pain in the chest Respiratory: Effort normal, non-labored breathing.  GI: Soft.  No distension. There is no tenderness.  Skin: WDI.   Neurologic Examination  Awake alert oriented x 3 Poor attention concentration Almost volitional withholding of speech while trying to talk. No significant dysarthria or aphasia Cranial nerves II to XII: Pupils equal round react light, extraocular movements intact, visual fields full, face appears symmetric, facial sensation diminished on the left with sharp cut off in the midline to light touch and splitting of vibratory sense, tongue and palate midline. Motor examination with no drift in the left upper extremity or right upper extremity.  Mild drift in the left lower extremity-also positive Hoover sign. Sensation diminished on the left hemibody.  K[place]olph in the midline. Coordination examination with no dysmetria  Labs/Imaging/Neurodiagnostic studies   CBC:  Recent Labs  Lab March 14, 2024 1150  WBC 10.1  NEUTROABS 6.7  HGB 13.6  HCT 40.8  MCV 91.7  PLT 271   Basic Metabolic Panel:  Lab Results  Component Value Date   NA 137 Mar 14, 2024   K 3.2 (L) 2024/03/14   CO2 19 (L) 03/14/2024   GLUCOSE 126 (H) 03/14/24   BUN 11 03/14/2024   CREATININE 0.78 03/14/24   CALCIUM  8.9 2024-03-14   GFRNONAA >60 03/14/24   GFRAA >60 08/09/2015   Lipid Panel:  Lab Results  Component Value Date    LDLCALC 132 (H) 05/14/2023   Urine Drug Screen:     Component Value Date/Time   LABOPIA NONE DETECTED 05/12/2023 1614   COCAINSCRNUR NONE DETECTED 05/12/2023 1614   LABBENZ NONE DETECTED 05/12/2023 1614   AMPHETMU NONE DETECTED 05/12/2023 1614   THCU POSITIVE (A) 05/12/2023 1614   LABBARB NONE DETECTED 05/12/2023 1614    Alcohol Level     Component Value Date/Time   ETH <15 14-Mar-2024 1150   INR  Lab Results  Component Value Date   INR 1.0 03/14/2024   APTT  Lab Results  Component Value Date  APTT 30 03/01/2024   CT Head without contrast(Personally reviewed): No acute findings  CT angio Head and Neck with contrast(Personally reviewed): No ELVO.  No high-grade stenosis, aneurysm   ASSESSMENT   Madeline [name] is a 48 y.o. 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 Exam has multiple functional components.  History of headache makes either a functional neurological disorder or a complicated migraine as a likely etiology.  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 Plan discussed with Dr. Ester Sharps   Addendum MRI brain-no acute intracranial abnormality. Plan as above Please call with questions as needed ______________________________________________________________________    Signed, Eligio Lav, MD [facility]

## 2024-03-01 NOTE — ED Notes (Signed)
 CBG 120

## 2024-03-01 NOTE — ED Triage Notes (Signed)
 Pt to ED via [facility] from home for stroke like symptoms. Initial call out was for dizziness and confusion. Upon EMS arrival, pt was confused, aphasic, had left sided numbness, L sided weakness. LWK 1105 today. Pt reports headache x1 week. Pt reports chest pain tender to palpation on arrival. Pt speaking in full sentences and is oriented x4 at this time.   EMS vitals  BP 130/78 HR 86 SPO2 98 Temp 98.6 CBG 88

## 2024-03-19 ENCOUNTER — Ambulatory Visit

## 2024-04-02 NOTE — Progress Notes (Unsigned)
 Cardiology Office Note   Date:  04/03/2024  ID:  Madeline [name], DOB 1976-04-03, MRN [id number] PCP: Madeline Stefano Iles, MD   [facility] Providers Cardiologist:  Caron Poser, MD     History of Present Illness Madeline [name] is a 48 y.o. female PMH HLD, CAD status post complex PCI to proximal LAD (04/24/23) who presents for further evaluation management of dyspnea on exertion.  Recently seen in the ED on 03/01/2024 for a headache.  Serial troponin was negative.  Alcohol level was negative.  A CT code stroke was called and CTA head and neck and CT head did not demonstrate any obvious infarct.  An MRI brain showed the same.  Last LDL 132 05/2023.  Patient reports that she recently relocated back to [place] from [place]  to care for her elderly mother.  She notes that she feels exhausted all the time with minimal exertion.  She denies any obvious chest discomfort.  She has been taking ASA but the Plavix  was discontinued at some point.  Relevant CVD History -Complex PCI ostial to mid LAD 04/24/2023 [facility]) -Invasive coronary angiogram 04/14/2023 99% ostial to mid LAD with extensive right to left collaterals and otherwise mild nonobstructive CAD in the other territories; ejection fraction 30 to 35% by ventriculogram - TTE 03/2023 LVEF 30% with grade 1 diastolic dysfunction, normal RV function, mild MR   ROS: Pt denies any chest discomfort, jaw pain, arm pain, palpitations, syncope, presyncope, orthopnea, PND, or LE edema.  Studies Reviewed I have independently reviewed the patient's ECG, previous medical records, previous cardiac testing, recent blood work.  Physical Exam VS:  BP 120/76   Pulse 65   Ht 5' 1 (1.549 m)   Wt 161 lb 3.2 oz (73.1 kg)   SpO2 98%   BMI 30.46 kg/m        Wt Readings from Last 3 Encounters:  04/03/24 161 lb 3.2 oz (73.1 kg)  03/01/24 153 lb 3.5 oz (69.5 kg)  05/12/23 158 lb (71.7 kg)    GEN: No acute distress. NECK: No JVD; No carotid  bruits. CARDIAC: RRR, no murmurs, rubs, gallops. RESPIRATORY:  Clear to auscultation. EXTREMITIES:  Warm and well-perfused. No edema.  ASSESSMENT AND PLAN DOE CAD status post complex PCI LAD 04/2023 Ischemic cardiomyopathy by ventriculogram Patient is status post complex PCI to the ostial LAD 04/2023.  Initial angiogram prior to complex PCI showed a likely CTO in this territory.  Ventriculogram and echocardiogram at that time noted reduced ejection fraction.  I do not see any cardiac testing done since that time.  ECG with anteroseptal infarct.  We discussed empirically starting heart failure GDMT versus awaiting further test results - she would like to wait on test results to ensure that we will have her on the correct medications.  She has also been off Plavix  for an undetermined amount of time.  Her primary complaint today is generalized fatigue and DOE without chest discomfort.  Plan: - Echocardiogram to reevaluate ejection fraction following revascularization; there has been no interval testing done and she has not been on optimal HF GDMT - Stress PET to evaluate for infarct/ischemia and further risk stratify - Continue ASA 81 mg daily; she has been off Plavix  for an undetermined amount of time.  Since we are nearly at the 1 year mark from her PCI, I do not see a great reason to restart. - Continue metoprolol  XL 25 mg daily - Continue Crestor  40 mg daily - Continue Imdur  30 mg daily -  As above, we will hold off on the remainder of GDMT per request until we have an updated echocardiogram since she has been revascularized.  If her ejection fraction is still reduced, then she will need to be on ACE/ARB/ARNI, SGLT2 inhibitor, MRA.  We can likely stop the Imdur  at that time to allow more blood pressure room for GDMT. - She appears euvolemic today, so we will hold off on diuresis.   HLD Last LDL 132 in 2024.  She is currently taking Crestor  40 mg daily.  We will recheck today.  Goal LDL less  than 55.  We may need to add Repatha to get to this goal.      Informed Consent   The risks [chest pain, shortness of breath, cardiac arrhythmias, dizziness, blood pressure fluctuations, myocardial infarction, stroke/transient ischemic attack, nausea, vomiting, allergic reaction, radiation exposure, metallic taste sensation and life-threatening complications (estimated to be 1 in 10,000)], benefits (risk stratification, diagnosing coronary artery disease, treatment guidance) and alternatives of a cardiac PET stress test were discussed in detail with [name] and she agrees to proceed.     Dispo: RTC 3 months or sooner PRN  Signed, Caron Poser, MD

## 2024-04-03 ENCOUNTER — Ambulatory Visit

## 2024-04-03 VITALS — BP 120/76 | HR 65 | Ht 61.0 in | Wt 161.2 lb

## 2024-04-03 DIAGNOSIS — I255 Ischemic cardiomyopathy: Secondary | ICD-10-CM | POA: Diagnosis not present

## 2024-04-03 DIAGNOSIS — Z95820 Peripheral vascular angioplasty status with implants and grafts: Secondary | ICD-10-CM

## 2024-04-03 DIAGNOSIS — I25118 Atherosclerotic heart disease of native coronary artery with other forms of angina pectoris: Secondary | ICD-10-CM | POA: Diagnosis not present

## 2024-04-03 DIAGNOSIS — E782 Mixed hyperlipidemia: Secondary | ICD-10-CM

## 2024-04-03 DIAGNOSIS — R079 Chest pain, unspecified: Secondary | ICD-10-CM

## 2024-04-03 NOTE — Patient Instructions (Signed)
 Medication Instructions:  Your physician recommends that you continue on your current medications as directed. Please refer to the Current Medication list given to you today.  *If you need a refill on your cardiac medications before your next appointment, please call your pharmacy*  Lab Work: Your provider would like for you to have following labs drawn today LIPID PANEL.   If you have labs (blood work) drawn today and your tests are completely normal, you will receive your results only by: MyChart Message (if you have MyChart) OR A paper copy in the mail If you have any lab test that is abnormal or we need to change your treatment, we will call you to review the results.  Testing/Procedures: Your physician has requested that you have an echocardiogram. Echocardiography is a painless test that uses sound waves to create images of your heart. It provides your doctor with information about the size and shape of your heart and how well your heart's chambers and valves are working.   You may receive an ultrasound enhancing agent through an IV if needed to better visualize your heart during the echo. This procedure takes approximately one hour.  There are no restrictions for this procedure.  This will take place at [address] [facility]) [address], [place] [address]  Please note: We ask at that you not bring children with you during ultrasound (echo/ vascular) testing. Due to room size and safety concerns, children are not allowed in the ultrasound rooms during exams. Our front office staff cannot provide observation of children in our lobby area while testing is being conducted. An adult accompanying a patient to their appointment will only be allowed in the ultrasound room at the discretion of the ultrasound technician under special circumstances. We apologize for any inconvenience.     CARDIAC PET/CT STRESS TEST:  Please report to Radiology at [facility]  Main Entrance, medical mall, 30 mins prior to your test.  [address]  [place], [place]  How to Prepare for Your Cardiac PET/CT Stress Test:  Nothing to eat or drink, except water, 3 hours prior to arrival time.  NO caffeine/decaffeinated products, or chocolate 12 hours prior to arrival. (Please note decaffeinated beverages (teas/coffees) still contain caffeine).  If you have caffeine within 12 hours prior, the test will need to be rescheduled.  Medication instructions:  Do not take nitrates (isosorbide /IMDUR ) the day before or day of test  You may take your remaining medications with water.  NO perfume, cologne or lotion on chest or abdomen area. FEMALES - Please avoid wearing dresses to this appointment.  Total time is 1 to 2 hours; you may want to bring reading material for the waiting time.  IF YOU THINK YOU MAY BE PREGNANT, OR ARE NURSING PLEASE INFORM THE TECHNOLOGIST.  In preparation for your appointment, medication and supplies will be purchased.  Appointment availability is limited, so if you need to cancel or reschedule, please call the Radiology Department Scheduler at [phone] 24 hours in advance to avoid a cancellation fee of $100.00  What to Expect When you Arrive:  Once you arrive and check in for your appointment, you will be taken to a preparation room within the Radiology Department.  A technologist or Nurse will obtain your medical history, verify that you are correctly prepped for the exam, and explain the procedure.  Afterwards, an IV will be started in your arm and electrodes will be placed on your skin for EKG monitoring during  the stress portion of the exam. Then you will be escorted to the PET/CT scanner.  There, staff will get you positioned on the scanner and obtain a blood pressure and EKG.  During the exam, you will continue to be connected to the EKG and blood pressure machines.  A small, safe amount of a radioactive tracer will be injected in your  IV to obtain a series of pictures of your heart along with an injection of a stress agent.    After your Exam:  It is recommended that you eat a meal and drink a caffeinated beverage to counter act any effects of the stress agent.  Drink plenty of fluids for the remainder of the day and urinate frequently for the first couple of hours after the exam.  Your doctor will inform you of your test results within 7-10 business days.  For more information and frequently asked questions, please visit our website: [email or web address]  For questions about your test or how to prepare for your test, please call: Cardiac Imaging Nurse Navigators Office: [phone]   Follow-Up: At [facility], you and your health needs are our priority.  As part of our continuing mission to provide you with exceptional heart care, our providers are all part of one team.  This team includes your primary Cardiologist (physician) and Advanced Practice Providers or APPs (Physician Assistants and Nurse Practitioners) who all work together to provide you with the care you need, when you need it.  Your next appointment:  3 month(s)  Provider:  Caron Poser, MD    We recommend signing up for the patient portal called MyChart.  Sign up information is provided on this After Visit Summary.  MyChart is used to connect with patients for Virtual Visits (Telemedicine).  Patients are able to view lab/test results, encounter notes, upcoming appointments, etc.  Non-urgent messages can be sent to your provider as well.   To learn more about what you can do with MyChart, go to [email or web address].

## 2024-04-04 ENCOUNTER — Other Ambulatory Visit: Payer: Self-pay

## 2024-04-04 ENCOUNTER — Other Ambulatory Visit: Payer: Self-pay | Admitting: Emergency Medicine

## 2024-04-04 ENCOUNTER — Ambulatory Visit: Payer: Self-pay

## 2024-04-04 LAB — LIPID PANEL
Chol/HDL Ratio: 2.2 ratio (ref 0.0–4.4)
Cholesterol, Total: 117 mg/dL (ref 100–199)
HDL: 54 mg/dL (ref >39)
LDL Chol Calc (NIH): 44 mg/dL (ref 0–99)
Triglycerides: 105 mg/dL (ref 0–149)
VLDL Cholesterol Cal: 19 mg/dL (ref 5–40)

## 2024-04-04 MED ORDER — ROSUVASTATIN CALCIUM 40 MG PO TABS: 40.0000 mg | ORAL_TABLET | Freq: Every day | ORAL | 3 refills | Status: AC

## 2024-04-04 MED ORDER — ISOSORBIDE MONONITRATE ER 30 MG PO TB24: 30.0000 mg | ORAL_TABLET | Freq: Every day | ORAL | 3 refills | Status: DC

## 2024-04-06 MED ORDER — METOPROLOL SUCCINATE ER 25 MG PO TB24: 12.5000 mg | ORAL_TABLET | Freq: Every day | ORAL | 0 refills | Status: DC

## 2024-04-10 ENCOUNTER — Encounter (HOSPITAL_COMMUNITY): Payer: Self-pay

## 2024-04-12 ENCOUNTER — Ambulatory Visit
Admission: RE | Admit: 2024-04-12 | Discharge: 2024-04-12 | Disposition: A | Discharge status: Home / Self Care | Source: Ambulatory Visit

## 2024-04-12 DIAGNOSIS — R079 Chest pain, unspecified: Secondary | ICD-10-CM | POA: Insufficient documentation

## 2024-04-12 DIAGNOSIS — K838 Other specified diseases of biliary tract: Secondary | ICD-10-CM | POA: Diagnosis not present

## 2024-04-12 DIAGNOSIS — I25118 Atherosclerotic heart disease of native coronary artery with other forms of angina pectoris: Secondary | ICD-10-CM | POA: Insufficient documentation

## 2024-04-12 DIAGNOSIS — Z9049 Acquired absence of other specified parts of digestive tract: Secondary | ICD-10-CM | POA: Diagnosis not present

## 2024-04-12 LAB — NM PET CT CARDIAC PERFUSION MULTI W/ABSOLUTE BLOODFLOW
LV dias vol: 153 mL (ref 46–106)
LV sys vol: 93 mL (ref 3.8–5.2)
Nuc Rest EF: 36 %
Nuc Stress EF: 39 %
Peak HR: 92 {beats}/min
Rest HR: 55 {beats}/min
Rest Nuclear Isotope Dose: 18.8 mCi
SRS: 27
SSS: 31
Stress Nuclear Isotope Dose: 18.9 mCi
TID: 0.88

## 2024-04-12 MED ORDER — RUBIDIUM RB82 GENERATOR (RUBYFILL)
25.0000 | PACK | Freq: Once | INTRAVENOUS | Status: AC
  Administered 2024-04-12: 18.87 via INTRAVENOUS

## 2024-04-12 MED ORDER — REGADENOSON 0.4 MG/5ML IV SOLN
INTRAVENOUS | Status: AC
  Filled 2024-04-12: qty 5

## 2024-04-12 MED ORDER — RUBIDIUM RB82 GENERATOR (RUBYFILL)
25.0000 | PACK | Freq: Once | INTRAVENOUS | Status: AC
  Administered 2024-04-12: 18.83 via INTRAVENOUS

## 2024-04-12 MED ORDER — REGADENOSON 0.4 MG/5ML IV SOLN
0.4000 mg | Freq: Once | INTRAVENOUS | Status: AC
  Administered 2024-04-12: 0.4 mg via INTRAVENOUS
  Filled 2024-04-12: qty 5

## 2024-04-12 NOTE — OR Nursing (Signed)
 Pt present for PET CT, BP in 93/55, 99/66 Dr End aware, Ok to proceed.

## 2024-04-13 ENCOUNTER — Other Ambulatory Visit: Payer: Self-pay | Admitting: *Deleted

## 2024-04-13 ENCOUNTER — Other Ambulatory Visit: Payer: Self-pay

## 2024-04-13 DIAGNOSIS — I255 Ischemic cardiomyopathy: Secondary | ICD-10-CM

## 2024-04-13 DIAGNOSIS — I25118 Atherosclerotic heart disease of native coronary artery with other forms of angina pectoris: Secondary | ICD-10-CM

## 2024-04-13 MED ORDER — LOSARTAN POTASSIUM 25 MG PO TABS: 25.0000 mg | ORAL_TABLET | Freq: Every day | ORAL | 3 refills | Status: AC

## 2024-04-13 MED ORDER — METOPROLOL SUCCINATE ER 25 MG PO TB24
25.0000 mg | ORAL_TABLET | Freq: Every day | ORAL | 0 refills | Status: DC
  Filled 2024-04-13: qty 45, 45d supply, fill #0

## 2024-04-13 MED ORDER — LOSARTAN POTASSIUM 25 MG PO TABS
25.0000 mg | ORAL_TABLET | Freq: Every day | ORAL | 3 refills | Status: DC
  Filled 2024-04-13: qty 90, 90d supply, fill #0

## 2024-04-13 MED ORDER — METOPROLOL SUCCINATE ER 25 MG PO TB24: 25.0000 mg | ORAL_TABLET | Freq: Every day | ORAL | 0 refills | Status: DC

## 2024-04-30 NOTE — Progress Notes (Unsigned)
 Cardiology Office Note   Date:  05/02/2024  ID:  Madeline [name], DOB 01/24/1976, MRN [id number] PCP: Kandis Stefano Iles, MD  [facility] Providers Cardiologist:  Caron Poser, MD     History of Present Illness Madeline [name] is a 48 y.o. female PMH HLD, CAD status post complex PCI to proximal LAD (04/24/23) who presents for further evaluation management of dyspnea on exertion.  Recently seen in the ED on 03/01/2024 for a headache.  Serial troponin was negative.  Alcohol level was negative.  A CT code stroke was called and CTA head and neck and CT head did not demonstrate any obvious infarct.  An MRI brain showed the same.  Last LDL 132 05/2023.  Patient reports that she recently relocated back to [place] from [place]  to care for her elderly mother.  She notes that she feels exhausted all the time with minimal exertion.  She denies any obvious chest discomfort.  She has been taking ASA but the Plavix  was discontinued at some point.  Interval history: Since last visit, we obtained a stress PET which showed large area of infarction in LAD territory and moderately reduced LV function.  LDL was 44.  She notes she more or less feels the same, no worse or no better.  She is still active and able to get around but is limited somewhat by DOE.  She denies any orthopnea or significant LE edema.  Relevant CVD History -Stress PET 03/2024 large fixed defect LAD territory with moderately reduced LV function 36% -Complex PCI ostial to mid LAD 04/24/2023 [facility]) -Invasive coronary angiogram 04/14/2023 99% ostial to mid LAD with extensive right to left collaterals and otherwise mild nonobstructive CAD in the other territories; ejection fraction 30 to 35% by ventriculogram - TTE 03/2023 LVEF 30% with grade 1 diastolic dysfunction, normal RV function, mild MR   ROS: Pt denies any chest discomfort, jaw pain, arm pain, palpitations, syncope, presyncope, orthopnea, PND, or LE edema.  Studies Reviewed I  have independently reviewed the patient's ECG, previous medical records, previous cardiac testing, recent blood work.  Physical Exam VS:  BP 116/68 (BP Location: Left Arm, Patient Position: Sitting, Cuff Size: Normal)   Pulse 72   Ht 5' 2 (1.575 m)   Wt 159 lb (72.1 kg)   LMP 04/06/2024 (Within Days)   SpO2 99%   BMI 29.08 kg/m        Wt Readings from Last 3 Encounters:  05/02/24 159 lb (72.1 kg)  04/03/24 161 lb 3.2 oz (73.1 kg)  03/01/24 153 lb 3.5 oz (69.5 kg)    GEN: No acute distress. NECK: No JVD; No carotid bruits. CARDIAC: RRR, no murmurs, rubs, gallops. RESPIRATORY:  Clear to auscultation. EXTREMITIES:  Warm and well-perfused. No edema.  ASSESSMENT AND PLAN DOE CAD status post complex PCI LAD 04/2023 Ischemic cardiomyopathy Patient is status post complex PCI to the ostial LAD 04/2023.  Initial angiogram prior to complex PCI showed a likely CTO in this territory.  Ventriculogram and echocardiogram at that time noted reduced ejection fraction.  Stress PET 03/2024 showed large area of infarction in LAD territory with moderately reduced EF.  She is NYHA II currently.  No recent heart failure hospitalizations or significant diuretic needs.  Plan: - Continue ASA 81 mg daily - Continue Crestor  40 mg daily - Continue metoprolol  XL 25 mg daily - Continue losartan  25 mg daily - Euvolemic, so no diuretics for now - Recheck labs today; if creatinine and kidney function look good,  then we will start Farxiga and MRA.  Plan to recheck labs again in 2 weeks following this. - Referral to advanced heart failure given her young age and LAD territory infarction  HLD Last LDL 44 03/2024.  Continue Crestor  40 mg daily.  LDL goal less than 55.   Dispo: RTC 3 months or sooner as needed  Signed, Caron Poser, MD

## 2024-05-02 ENCOUNTER — Ambulatory Visit

## 2024-05-02 VITALS — BP 116/68 | HR 72 | Ht 62.0 in | Wt 159.0 lb

## 2024-05-02 DIAGNOSIS — Z79899 Other long term (current) drug therapy: Secondary | ICD-10-CM | POA: Diagnosis not present

## 2024-05-02 DIAGNOSIS — I25118 Atherosclerotic heart disease of native coronary artery with other forms of angina pectoris: Secondary | ICD-10-CM

## 2024-05-02 DIAGNOSIS — Z95820 Peripheral vascular angioplasty status with implants and grafts: Secondary | ICD-10-CM | POA: Diagnosis not present

## 2024-05-02 DIAGNOSIS — E782 Mixed hyperlipidemia: Secondary | ICD-10-CM

## 2024-05-02 DIAGNOSIS — I255 Ischemic cardiomyopathy: Secondary | ICD-10-CM | POA: Diagnosis not present

## 2024-05-02 NOTE — Patient Instructions (Signed)
 Medication Instructions:  Your physician recommends that you continue on your current medications as directed. Please refer to the Current Medication list given to you today.  *If you need a refill on your cardiac medications before your next appointment, please call your pharmacy*  Lab Work: Your provider would like for you to have following labs drawn today BMP.    Your provider would like for you to return in 2 weeks to have the following labs drawn: BMP.   Please go to [facility] [address] [address], [place] [address] You do not need an appointment.  They are open from 8 am- 4:30 pm.  Lunch from 1:00 pm- 2:00 pm You do not need to be fasting.    Testing/Procedures: No test ordered today   Follow-Up: At [facility], you and your health needs are our priority.  As part of our continuing mission to provide you with exceptional heart care, our providers are all part of one team.  This team includes your primary Cardiologist (physician) and Advanced Practice Providers or APPs (Physician Assistants and Nurse Practitioners) who all work together to provide you with the care you need, when you need it.  Your next appointment:   3 month(s)  Provider:   Caron Poser, MD

## 2024-05-03 ENCOUNTER — Ambulatory Visit: Payer: Self-pay

## 2024-05-03 LAB — BASIC METABOLIC PANEL WITH GFR
BUN/Creatinine Ratio: 11 (ref 9–23)
BUN: 9 mg/dL (ref 6–24)
CO2: 22 mmol/L (ref 20–29)
Calcium: 9.1 mg/dL (ref 8.7–10.2)
Chloride: 107 mmol/L — ABNORMAL HIGH (ref 96–106)
Creatinine, Ser: 0.8 mg/dL (ref 0.57–1.00)
Glucose: 75 mg/dL (ref 70–99)
Potassium: 3.9 mmol/L (ref 3.5–5.2)
Sodium: 141 mmol/L (ref 134–144)
eGFR: 92 mL/min/1.73 (ref >59)

## 2024-05-04 ENCOUNTER — Other Ambulatory Visit (HOSPITAL_COMMUNITY): Payer: Self-pay

## 2024-05-04 ENCOUNTER — Other Ambulatory Visit: Payer: Self-pay

## 2024-05-04 ENCOUNTER — Other Ambulatory Visit: Payer: Self-pay | Admitting: *Deleted

## 2024-05-04 MED ORDER — SPIRONOLACTONE 25 MG PO TABS
25.0000 mg | ORAL_TABLET | Freq: Every day | ORAL | 3 refills | Status: AC
  Filled 2024-05-04 (×2): qty 90, 90d supply, fill #0

## 2024-05-04 MED ORDER — DAPAGLIFLOZIN PROPANEDIOL 10 MG PO TABS
10.0000 mg | ORAL_TABLET | Freq: Every day | ORAL | 3 refills | Status: AC
  Filled 2024-05-04 (×2): qty 90, 90d supply, fill #0

## 2024-05-30 ENCOUNTER — Ambulatory Visit

## 2024-05-30 ENCOUNTER — Other Ambulatory Visit: Payer: Self-pay

## 2024-05-30 DIAGNOSIS — Z79899 Other long term (current) drug therapy: Secondary | ICD-10-CM

## 2024-05-30 DIAGNOSIS — R079 Chest pain, unspecified: Secondary | ICD-10-CM | POA: Diagnosis not present

## 2024-05-30 DIAGNOSIS — I25118 Atherosclerotic heart disease of native coronary artery with other forms of angina pectoris: Secondary | ICD-10-CM | POA: Diagnosis not present

## 2024-05-30 LAB — ECHOCARDIOGRAM COMPLETE
AR max vel: 2.95 cm2
AV Area VTI: 2.56 cm2
AV Area mean vel: 2.79 cm2
AV Mean grad: 3 mmHg
AV Peak grad: 5.2 mmHg
Ao pk vel: 1.14 m/s
Area-P 1/2: 4.68 cm2
S' Lateral: 5.3 cm

## 2024-05-30 MED ORDER — PERFLUTREN LIPID MICROSPHERE
1.0000 mL | INTRAVENOUS | Status: AC | PRN
  Administered 2024-05-30: 3 mL via INTRAVENOUS

## 2024-05-31 LAB — BASIC METABOLIC PANEL WITH GFR
BUN/Creatinine Ratio: 11 (ref 9–23)
BUN: 11 mg/dL (ref 6–24)
CO2: 18 mmol/L — ABNORMAL LOW (ref 20–29)
Calcium: 8.8 mg/dL (ref 8.7–10.2)
Chloride: 107 mmol/L — ABNORMAL HIGH (ref 96–106)
Creatinine, Ser: 1.02 mg/dL — ABNORMAL HIGH (ref 0.57–1.00)
Glucose: 83 mg/dL (ref 70–99)
Potassium: 4.2 mmol/L (ref 3.5–5.2)
Sodium: 139 mmol/L (ref 134–144)
eGFR: 69 mL/min/1.73

## 2024-06-18 ENCOUNTER — Other Ambulatory Visit: Payer: Self-pay

## 2024-06-25 DIAGNOSIS — Z79899 Other long term (current) drug therapy: Secondary | ICD-10-CM

## 2024-07-04 ENCOUNTER — Ambulatory Visit

## 2024-08-01 ENCOUNTER — Ambulatory Visit
